# Patient Record
Sex: Male | Born: 1995
Health system: Southern US, Community
[De-identification: ages and names within clinical notes are randomized; demographics above are authoritative.]

## PROBLEM LIST (undated history)

## (undated) ENCOUNTER — Ambulatory Visit (HOSPITAL_COMMUNITY): Admission: EM | Payer: Self-pay | Source: Home / Self Care

## (undated) DIAGNOSIS — B20 Human immunodeficiency virus [HIV] disease: Secondary | ICD-10-CM

## (undated) HISTORY — PX: NO PAST SURGERIES: SHX2092

---
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
## 2017-01-06 ENCOUNTER — Telehealth: Payer: Self-pay | Admitting: *Deleted

## 2017-01-06 NOTE — Telephone Encounter (Signed)
Patient shared that he stopped taking his bipolar medication, Zoloft, a couple of months ago.  That medication is still showing on his medication profile.  He would like his medication profile updated.  He is requesting that Dr. Drue SecondSnider call [employer] to let them know that he is no longer taking the Zoloft.  Sending message to Dr. Drue SecondSnider.  Phone numbers to call at [employer] are:  [phone] or [phone].

## 2017-01-08 NOTE — Telephone Encounter (Signed)
I can do that but does it matter?

## 2017-01-09 NOTE — Telephone Encounter (Signed)
Yes,  He definitely wants Dr. Drue SecondSnider to call [employer] about the medication.  He would also appreciate Dr. Drue SecondSnider calling him at [phone].

## 2017-01-09 NOTE — Telephone Encounter (Signed)
I called and spoke to patient and left message at [employer]

## 2017-01-20 ENCOUNTER — Telehealth: Payer: Self-pay | Admitting: *Deleted

## 2017-01-20 DIAGNOSIS — B20 Human immunodeficiency virus [HIV] disease: Secondary | ICD-10-CM

## 2017-01-20 MED ORDER — ELVITEG-COBIC-EMTRICIT-TENOFAF 150-150-200-10 MG PO TABS: 1.0000 | ORAL_TABLET | Freq: Every day | ORAL | 1 refills | Status: DC

## 2017-01-20 NOTE — Telephone Encounter (Signed)
Requesting 90-day refill of Genvoya to be sent to [facility] on [address].  RX sent.

## 2017-01-28 ENCOUNTER — Other Ambulatory Visit: Payer: Self-pay | Admitting: Internal Medicine

## 2017-01-28 ENCOUNTER — Encounter: Payer: Self-pay | Admitting: Internal Medicine

## 2017-01-28 ENCOUNTER — Ambulatory Visit (INDEPENDENT_AMBULATORY_CARE_PROVIDER_SITE_OTHER): Payer: Medicaid Other | Admitting: Internal Medicine

## 2017-01-28 VITALS — BP 137/81 | HR 75 | Temp 98.0°F | Ht 64.0 in | Wt 166.0 lb

## 2017-01-28 DIAGNOSIS — B20 Human immunodeficiency virus [HIV] disease: Secondary | ICD-10-CM

## 2017-01-28 DIAGNOSIS — G44039 Episodic paroxysmal hemicrania, not intractable: Secondary | ICD-10-CM

## 2017-01-28 MED ORDER — IBUPROFEN 600 MG PO TABS: 600.0000 mg | ORAL_TABLET | Freq: Four times a day (QID) | ORAL | 0 refills | Status: DC | PRN

## 2017-01-28 MED ORDER — ELVITEG-COBIC-EMTRICIT-TENOFAF 150-150-200-10 MG PO TABS: 1.0000 | ORAL_TABLET | Freq: Every day | ORAL | 1 refills | Status: DC

## 2017-01-28 MED FILL — GENVOYA TABLET: 150-150-200 | 30 days supply | Qty: 30 | Fill #0

## 2017-01-28 NOTE — Progress Notes (Signed)
RFV: hiv disease follow up  Patient ID: Matthew Schroeder, male   DOB: 03-01-1996, 21 y.o.   MRN: [id number]  HPI Matthew Schroeder is a 21yo M with hiv disease and bipolar disease, CD 4 count of 430/VL 104. On genvoya.  He reports taking daily wihtout missing doses. He states that he has not had any unprotected sex since his last visit. 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 He has been healthy overall.  He is wondering how to set up mailing of his medications.  Outpatient Encounter Prescriptions as of 01/28/2017  Medication Sig  . elvitegravir-cobicistat-emtricitabine-tenofovir (GENVOYA) 150-150-200-10 MG TABS tablet Take 1 tablet by mouth daily with breakfast.  . ibuprofen (ADVIL,MOTRIN) 600 MG tablet Take 1 tablet (600 mg total) by mouth every 6 (six) hours as needed for mild pain or moderate pain.  . [DISCONTINUED] naproxen (NAPROSYN) 375 MG tablet Take 1 tablet (375 mg total) by mouth 2 (two) times daily. (Patient not taking: Reported on 10/29/2016)  . [DISCONTINUED] ondansetron (ZOFRAN) 4 MG tablet Take 1 tablet (4 mg total) by mouth every 6 (six) hours. (Patient not taking: Reported on 10/29/2016)  . [DISCONTINUED] sertraline (ZOLOFT) 50 MG tablet Take 50 mg by mouth daily.   No facility-administered encounter medications on file as of 01/28/2017.      Patient Active Problem List   Diagnosis Date Noted  . Mixed bipolar I disorder (HCC) 07/16/2016  . Attention deficit hyperactivity disorder (ADHD) 07/16/2016  . HIV (human immunodeficiency virus infection) (HCC) 06/08/2016     Health Maintenance Due  Topic Date Due  . INFLUENZA VACCINE  06/25/2016     Review of Systems Review of Systems  Constitutional: Negative for fever, chills, diaphoresis, activity change, appetite change, fatigue and unexpected weight change.  HENT: Negative for congestion, sore throat, rhinorrhea, sneezing, trouble swallowing and sinus pressure.  Eyes:  Negative for photophobia and visual disturbance.  Respiratory: Negative for cough, chest tightness, shortness of breath, wheezing and stridor.  Cardiovascular: Negative for chest pain, palpitations and leg swelling.  Gastrointestinal: Negative for nausea, vomiting, abdominal pain, diarrhea, constipation, blood in stool, abdominal distention and anal bleeding.  Genitourinary: Negative for dysuria, hematuria, flank pain and difficulty urinating.  Musculoskeletal: Negative for myalgias, back pain, joint swelling, arthralgias and gait problem.  Skin: Negative for color change, pallor, rash and wound.  Neurological: + occasional headache Hematological: Negative for adenopathy. Does not bruise/bleed easily.  Psychiatric/Behavioral: Negative for behavioral problems, confusion, sleep disturbance, dysphoric mood, decreased concentration and agitation.    Physical Exam   BP 137/81   Pulse 75   Temp 98 F (36.7 C) (Oral)   Ht 5\' 4"  (1.626 m)   Wt 166 lb (75.3 kg)   BMI 28.49 kg/m   Physical Exam  Constitutional: He is oriented to person, place, and time. He appears well-developed and well-nourished. No distress.  HENT:  Mouth/Throat: Oropharynx is clear and moist. No oropharyngeal exudate.  Cardiovascular: Normal rate, regular rhythm and normal heart sounds. Exam reveals no gallop and no friction rub.  No murmur heard.  Pulmonary/Chest: Effort normal and breath sounds normal. No respiratory distress. He has no wheezes.  Abdominal: Soft. Bowel sounds are normal. He exhibits no distension. There is no tenderness.  Lymphadenopathy:  He has no cervical adenopathy.  Neurological: He is alert and oriented to person, place, and time.  Skin: Skin is warm and dry. No rash noted. No erythema.  Psychiatric: He has a  normal mood and affect. His behavior is normal.    Lab Results  Component Value Date   CD4TCELL 24 (L) 10/29/2016   Lab Results  Component Value Date   CD4TABS 430 10/29/2016    CD4TABS 610 08/08/2016   Lab Results  Component Value Date   HIV1RNAQUANT 104 (H) 10/29/2016   No results found for: HEPBSAB Lab Results  Component Value Date   LABRPR NON REAC 08/08/2016    CBC Lab Results  Component Value Date   WBC 3.9 (L) 10/29/2016   RBC 5.38 10/29/2016   HGB 15.9 10/29/2016   HCT 47.7 10/29/2016   PLT 318 10/29/2016   MCV 88.7 10/29/2016   MCH 29.6 10/29/2016   MCHC 33.3 10/29/2016   RDW 15.5 (H) 10/29/2016   LYMPHSABS 1,872 10/29/2016   MONOABS 234 10/29/2016   EOSABS 78 10/29/2016    BMET Lab Results  Component Value Date   NA 138 10/29/2016   K 4.2 10/29/2016   CL 104 10/29/2016   CO2 26 10/29/2016   GLUCOSE 97 10/29/2016   BUN 12 10/29/2016   CREATININE 1.16 10/29/2016   CALCIUM 9.6 10/29/2016   GFRNONAA >89 10/29/2016   GFRAA SEE NOTE 10/29/2016      Assessment and Plan  hiv disease= he has low lever viremia. wil check hiv viral load today. Continue on genvoya for now  Health maintenance = he has had hpv and meningococcal in 2016. No need for further vaccines at this time.  Headache = will refill ibuprofen to use prn for headache  rtc in 3 months

## 2017-01-30 LAB — HIV-1 RNA QUANT-NO REFLEX-BLD
HIV 1 RNA Quant: <20 copies/mL
HIV-1 RNA Quant, Log: <1.3 Log copies/mL

## 2017-01-30 MED FILL — IBUPROFEN 600 MG TABLET: 600 | 7 days supply | Qty: 30 | Fill #0

## 2017-02-26 MED FILL — GENVOYA TABLET: 150-150-200 | 30 days supply | Qty: 30 | Fill #1

## 2017-03-21 MED FILL — GENVOYA TABLET: 150-150-200 | 30 days supply | Qty: 30 | Fill #2

## 2017-04-18 MED FILL — GENVOYA TABLET: 150-150-200 | 30 days supply | Qty: 30 | Fill #3

## 2017-04-30 ENCOUNTER — Ambulatory Visit (INDEPENDENT_AMBULATORY_CARE_PROVIDER_SITE_OTHER): Payer: Medicaid Other | Admitting: Internal Medicine

## 2017-04-30 ENCOUNTER — Encounter: Payer: Self-pay | Admitting: Internal Medicine

## 2017-04-30 VITALS — BP 124/74 | HR 72 | Temp 98.3°F | Ht 65.0 in | Wt 166.0 lb

## 2017-04-30 DIAGNOSIS — Z23 Encounter for immunization: Secondary | ICD-10-CM | POA: Diagnosis not present

## 2017-04-30 DIAGNOSIS — B20 Human immunodeficiency virus [HIV] disease: Secondary | ICD-10-CM

## 2017-04-30 DIAGNOSIS — Z79899 Other long term (current) drug therapy: Secondary | ICD-10-CM

## 2017-04-30 LAB — CBC WITH DIFFERENTIAL/PLATELET
BASOS PCT: 0 %
Basophils Absolute: 0 cells/uL (ref 0–200)
EOS PCT: 2 %
Eosinophils Absolute: 92 cells/uL (ref 15–500)
HEMATOCRIT: 48.4 % (ref 38.5–50.0)
Hemoglobin: 16 g/dL (ref 13.2–17.1)
LYMPHS PCT: 48 %
Lymphs Abs: 2208 cells/uL (ref 850–3900)
MCH: 29.9 pg (ref 27.0–33.0)
MCHC: 33.1 g/dL (ref 32.0–36.0)
MCV: 90.3 fL (ref 80.0–100.0)
MPV: 8.9 fL (ref 7.5–12.5)
Monocytes Absolute: 322 cells/uL (ref 200–950)
Monocytes Relative: 7 %
NEUTROS PCT: 43 %
Neutro Abs: 1978 cells/uL (ref 1500–7800)
PLATELETS: 257 10*3/uL (ref 140–400)
RBC: 5.36 MIL/uL (ref 4.20–5.80)
RDW: 15 % (ref 11.0–15.0)
WBC: 4.6 10*3/uL (ref 3.8–10.8)

## 2017-04-30 LAB — BASIC METABOLIC PANEL
BUN: 12 mg/dL (ref 7–20)
CO2: 22 mmol/L (ref 20–31)
Calcium: 9.5 mg/dL (ref 8.9–10.4)
Chloride: 106 mmol/L (ref 98–110)
Creat: 1.09 mg/dL (ref 0.60–1.26)
Glucose, Bld: 90 mg/dL (ref 65–99)
POTASSIUM: 4 mmol/L (ref 3.8–5.1)
SODIUM: 142 mmol/L (ref 135–146)

## 2017-04-30 NOTE — Progress Notes (Signed)
RFV: Hiv disease follow up  Patient ID: Matthew Schroeder, male   DOB: October 02, 1996, 21 y.o.   MRN: [id number]  HPI Matthew Schroeder is a 21yo M in good state of health CD 4 count of 430/VL<20. Doing well on genvoya. No health problems. Not sexually active presently  Going to school this summer  [facility] HX: no smoking, no ETOh  Outpatient Encounter Prescriptions as of 04/30/2017  Medication Sig  . elvitegravir-cobicistat-emtricitabine-tenofovir (GENVOYA) 150-150-200-10 MG TABS tablet Take 1 tablet by mouth daily with breakfast.  . ibuprofen (ADVIL,MOTRIN) 600 MG tablet Take 1 tablet (600 mg total) by mouth every 6 (six) hours as needed for mild pain or moderate pain.   No facility-administered encounter medications on file as of 04/30/2017.      Patient Active Problem List   Diagnosis Date Noted  . Mixed bipolar I disorder (HCC) 07/16/2016  . Attention deficit hyperactivity disorder (ADHD) 07/16/2016  . HIV (human immunodeficiency virus infection) (HCC) 06/08/2016     There are no preventive care reminders to display for this patient.   Review of Systems  Constitutional: Negative for fever, chills, diaphoresis, activity change, appetite change, fatigue and unexpected weight change.  HENT: Negative for congestion, sore throat, rhinorrhea, sneezing, trouble swallowing and sinus pressure.  Eyes: Negative for photophobia and visual disturbance.  Respiratory: Negative for cough, chest tightness, shortness of breath, wheezing and stridor.  Cardiovascular: Negative for chest pain, palpitations and leg swelling.  Gastrointestinal: Negative for nausea, vomiting, abdominal pain, diarrhea, constipation, blood in stool, abdominal distention and anal bleeding.  Genitourinary: Negative for dysuria, hematuria, flank pain and difficulty urinating.  Musculoskeletal: Negative for myalgias, back pain, joint swelling, arthralgias and gait problem.  Skin: Negative for color change, pallor, rash and wound.    Neurological: Negative for dizziness, tremors, weakness and light-headedness.  Hematological: Negative for adenopathy. Does not bruise/bleed easily.  Psychiatric/Behavioral: Negative for behavioral problems, confusion, sleep disturbance, dysphoric mood, decreased concentration and agitation.    Physical Exam   BP 124/74   Pulse 72   Temp 98.3 F (36.8 C) (Oral)   Ht 5\' 5"  (1.651 m)   Wt 166 lb (75.3 kg)   BMI 27.62 kg/m    Constitutional: He is oriented to person, place, and time. He appears well-developed and well-nourished. No distress.  HENT:  Mouth/Throat: Oropharynx is clear and moist. No oropharyngeal exudate.  Cardiovascular: Normal rate, regular rhythm and normal heart sounds. Exam reveals no gallop and no friction rub.  No murmur heard.  Pulmonary/Chest: Effort normal and breath sounds normal. No respiratory distress. He has no wheezes.  Lymphadenopathy:  He has no cervical adenopathy.  Neurological: He is alert and oriented to person, place, and time.  Skin: Skin is warm and dry. No rash noted. No erythema.  Psychiatric: He has mildly flat affect   Lab Results  Component Value Date   CD4TCELL 24 (L) 10/29/2016   Lab Results  Component Value Date   CD4TABS 430 10/29/2016   CD4TABS 610 08/08/2016   Lab Results  Component Value Date   HIV1RNAQUANT <20 NOT DETECTED 01/28/2017   No results found for: HEPBSAB Lab Results  Component Value Date   LABRPR NON REAC 08/08/2016    CBC Lab Results  Component Value Date   WBC 3.9 (L) 10/29/2016   RBC 5.38 10/29/2016   HGB 15.9 10/29/2016   HCT 47.7 10/29/2016   PLT 318 10/29/2016   MCV 88.7 10/29/2016   MCH 29.6 10/29/2016   MCHC 33.3 10/29/2016  RDW 15.5 (H) 10/29/2016   LYMPHSABS 1,872 10/29/2016   MONOABS 234 10/29/2016   EOSABS 78 10/29/2016    BMET Lab Results  Component Value Date   NA 138 10/29/2016   K 4.2 10/29/2016   CL 104 10/29/2016   CO2 26 10/29/2016   GLUCOSE 97 10/29/2016   BUN 12  10/29/2016   CREATININE 1.16 10/29/2016   CALCIUM 9.6 10/29/2016   GFRNONAA >89 10/29/2016   GFRAA SEE NOTE 10/29/2016      Assessment and Plan  1. HIV disease - continue on genvoya. Will check labs today. Anticipate to be well controlled 2. Health maintenance - will give pneumococcal vaccine 3. Drug SE monitoring - will check Cr function

## 2017-05-01 ENCOUNTER — Ambulatory Visit: Payer: Self-pay | Admitting: Internal Medicine

## 2017-05-01 LAB — T-HELPER CELL (CD4) - (RCID CLINIC ONLY)
CD4 % Helper T Cell: 33 % (ref 33–55)
CD4 T CELL ABS: 860 /uL (ref 400–2700)

## 2017-05-02 LAB — HIV-1 RNA QUANT-NO REFLEX-BLD
HIV 1 RNA QUANT: DETECTED {copies}/mL — AB
HIV-1 RNA QUANT, LOG: DETECTED {Log_copies}/mL — AB

## 2017-05-12 MED FILL — GENVOYA TABLET: 150-150-200 | 30 days supply | Qty: 30 | Fill #4

## 2017-05-20 ENCOUNTER — Other Ambulatory Visit: Payer: Self-pay | Admitting: Internal Medicine

## 2017-05-20 DIAGNOSIS — B20 Human immunodeficiency virus [HIV] disease: Secondary | ICD-10-CM

## 2017-06-04 MED FILL — GENVOYA TABLET: 150-150-200 | 30 days supply | Qty: 30 | Fill #5

## 2017-06-27 ENCOUNTER — Other Ambulatory Visit: Payer: Self-pay | Admitting: Internal Medicine

## 2017-06-27 DIAGNOSIS — B20 Human immunodeficiency virus [HIV] disease: Secondary | ICD-10-CM

## 2017-06-27 MED FILL — GENVOYA TABLET: 150-150-200 | 30 days supply | Qty: 30 | Fill #0

## 2017-08-05 MED FILL — GENVOYA TABLET: 150-150-200 | 30 days supply | Qty: 30 | Fill #1

## 2017-08-25 MED FILL — SERTRALINE HCL 50 MG TABLET: 50 | 30 days supply | Qty: 30 | Fill #0

## 2017-09-01 ENCOUNTER — Telehealth: Payer: Self-pay

## 2017-09-01 NOTE — Telephone Encounter (Signed)
I called pt to remind him of his appt on 09/02/17 with Dr. Drue Second he stated that he did not want to continue therapy and would like to only receive medication. I asked the pt what he meant and he stated that "I would not like to talk to anyone and only get medication." I told him that he would still need to see his provider in order to continue taking his medication. He agreed with my answer and asked for the clinic's address before ending the call.  Lorenso Courier, [place]

## 2017-09-02 ENCOUNTER — Other Ambulatory Visit (HOSPITAL_COMMUNITY)
Admission: RE | Admit: 2017-09-02 | Discharge: 2017-09-02 | Disposition: A | Discharge status: Home / Self Care | Payer: Medicaid Other | Source: Ambulatory Visit | Attending: Internal Medicine | Admitting: Internal Medicine

## 2017-09-02 ENCOUNTER — Ambulatory Visit (INDEPENDENT_AMBULATORY_CARE_PROVIDER_SITE_OTHER): Payer: Medicaid Other | Admitting: Internal Medicine

## 2017-09-02 ENCOUNTER — Encounter: Payer: Self-pay | Admitting: Internal Medicine

## 2017-09-02 VITALS — BP 142/86 | HR 69 | Temp 98.2°F | Ht 65.0 in | Wt 172.0 lb

## 2017-09-02 DIAGNOSIS — Z79899 Other long term (current) drug therapy: Secondary | ICD-10-CM | POA: Diagnosis not present

## 2017-09-02 DIAGNOSIS — B20 Human immunodeficiency virus [HIV] disease: Secondary | ICD-10-CM | POA: Insufficient documentation

## 2017-09-02 DIAGNOSIS — Z23 Encounter for immunization: Secondary | ICD-10-CM | POA: Diagnosis not present

## 2017-09-02 NOTE — Progress Notes (Signed)
RFV: follow up for hiv  Patient ID: Matthew Schroeder, male   DOB: [birth year], 21 y.o.   MRN: [id number]  HPI Nas is a 21yo M with hiv disease with wild-type geno, dx in 2017 has been on ART since Dx and has been well controlled. Not sexually active since 2017 per his report. No recent illnesses.  04/2016: hiv geno from [facility]:  RT GENE MUTATIONS: R211Q PR GENE MUTATIONS: J19J,Y78G,N56O/Z  Outpatient Encounter Prescriptions as of 09/02/2017  Medication Sig  . GENVOYA 150-150-200-10 MG TABS tablet TAKE 1 TABLET BY MOUTH DAILY WITH BREAKFAST.  Marland Kitchen ibuprofen (ADVIL,MOTRIN) 600 MG tablet Take 1 tablet (600 mg total) by mouth every 6 (six) hours as needed for mild pain or moderate pain.   No facility-administered encounter medications on file as of 09/02/2017.      Patient Active Problem List   Diagnosis Date Noted  . Mixed bipolar I disorder (HCC) 07/16/2016  . Attention deficit hyperactivity disorder (ADHD) 07/16/2016  . HIV (human immunodeficiency virus infection) (HCC) 06/08/2016     Health Maintenance Due  Topic Date Due  . INFLUENZA VACCINE  06/25/2017     Review of Systems Review of Systems  Constitutional: Negative for fever, chills, diaphoresis, activity change, appetite change, fatigue and unexpected weight change.  HENT: Negative for congestion, sore throat, rhinorrhea, sneezing, trouble swallowing and sinus pressure.  Eyes: Negative for photophobia and visual disturbance.  Respiratory: Negative for cough, chest tightness, shortness of breath, wheezing and stridor.  Cardiovascular: Negative for chest pain, palpitations and leg swelling.  Gastrointestinal: Negative for nausea, vomiting, abdominal pain, diarrhea, constipation, blood in stool, abdominal distention and anal bleeding.  Genitourinary: Negative for dysuria, hematuria, flank pain and difficulty urinating.  Musculoskeletal: Negative for myalgias, back pain, joint swelling, arthralgias and gait problem.    Skin: Negative for color change, pallor, rash and wound.  Neurological: Negative for dizziness, tremors, weakness and light-headedness.  Hematological: Negative for adenopathy. Does not bruise/bleed easily.  Psychiatric/Behavioral: Negative for behavioral problems, confusion, sleep disturbance, dysphoric mood, decreased concentration and agitation.   Social History  Substance Use Topics  . Smoking status: Never Smoker  . Smokeless tobacco: Never Used  . Alcohol use No    Physical Exam   BP (!) 142/86   Pulse 69   Temp 98.2 F (36.8 C) (Oral)   Ht  (1.651 m)   Wt 172 lb (78 kg)   BMI 28.62 kg/m   Physical Exam  Constitutional: He is oriented to person, place, and time. He appears well-developed and well-nourished. No distress.  HENT:  Mouth/Throat: Oropharynx is clear and moist. No oropharyngeal exudate.  Cardiovascular: Normal rate, regular rhythm and normal heart sounds. Exam reveals no gallop and no friction rub.  No murmur heard.  Pulmonary/Chest: Effort normal and breath sounds normal. No respiratory distress. He has no wheezes.  Lymphadenopathy:  He has no cervical adenopathy.  Neurological: He is alert and oriented to person, place, and time.  Skin: Skin is warm and dry. No rash noted. No erythema.  Psychiatric: He has a normal mood and affect. His behavior is normal.    Lab Results  Component Value Date   CD4TCELL 33 04/30/2017   Lab Results  Component Value Date   CD4TABS 860 04/30/2017   CD4TABS 430 10/29/2016   CD4TABS 610 08/08/2016   Lab Results  Component Value Date   HIV1RNAQUANT <20 DETECTED (A) 04/30/2017   No results found for: HEPBSAB Lab Results  Component Value Date  LABRPR NON REAC 08/08/2016    CBC Lab Results  Component Value Date   WBC 4.6 04/30/2017   RBC 5.36 04/30/2017   HGB 16.0 04/30/2017   HCT 48.4 04/30/2017   PLT 257 04/30/2017   MCV 90.3 04/30/2017   MCH 29.9 04/30/2017   MCHC 33.1 04/30/2017   RDW 15.0  04/30/2017   LYMPHSABS 2,208 04/30/2017   MONOABS 322 04/30/2017   EOSABS 92 04/30/2017    BMET Lab Results  Component Value Date   NA 142 04/30/2017   K 4.0 04/30/2017   CL 106 04/30/2017   CO2 22 04/30/2017   GLUCOSE 90 04/30/2017   BUN 12 04/30/2017   CREATININE 1.09 04/30/2017   CALCIUM 9.5 04/30/2017   GFRNONAA >89 10/29/2016   GFRAA SEE NOTE 10/29/2016      Assessment and Plan  hiv disease = well controlled. Will check labs and then switch to q 6 months visits. Offered to switch to biktarvy but he was not interested  Health maintenance =received flu shot today.   Long-term medication = will check cr function while on genvoya  Hx of bipolar = not currently on medications and appears stable  rtc in 6 months

## 2017-09-03 LAB — URINE CYTOLOGY ANCILLARY ONLY
Chlamydia: NEGATIVE
NEISSERIA GONORRHEA: NEGATIVE

## 2017-09-03 LAB — COMPLETE METABOLIC PANEL WITH GFR
AG Ratio: 1.5 (calc) (ref 1.0–2.5)
ALBUMIN MSPROF: 4.5 g/dL (ref 3.6–5.1)
ALT: 10 U/L (ref 8–46)
AST: 16 U/L (ref 12–32)
Alkaline phosphatase (APISO): 56 U/L (ref 48–230)
BUN: 12 mg/dL (ref 7–20)
CALCIUM: 9.6 mg/dL (ref 8.9–10.4)
CO2: 24 mmol/L (ref 20–32)
CREATININE: 1.21 mg/dL (ref 0.60–1.26)
Chloride: 106 mmol/L (ref 98–110)
GFR, EST AFRICAN AMERICAN: 100 mL/min/{1.73_m2} (ref >=60)
GFR, EST NON AFRICAN AMERICAN: 86 mL/min/{1.73_m2} (ref >=60)
GLUCOSE: 87 mg/dL (ref 65–99)
Globulin: 3.1 g/dL (calc) (ref 2.1–3.5)
Potassium: 4.3 mmol/L (ref 3.8–5.1)
Sodium: 140 mmol/L (ref 135–146)
TOTAL PROTEIN: 7.6 g/dL (ref 6.3–8.2)
Total Bilirubin: 0.5 mg/dL (ref 0.2–1.1)

## 2017-09-03 LAB — CBC WITH DIFFERENTIAL/PLATELET
Basophils Absolute: 19 cells/uL (ref 0–200)
Basophils Relative: 0.5 %
EOS PCT: 1.1 %
Eosinophils Absolute: 41 cells/uL (ref 15–500)
HEMATOCRIT: 44.9 % (ref 38.5–50.0)
HEMOGLOBIN: 15.2 g/dL (ref 13.2–17.1)
LYMPHS ABS: 2142 {cells}/uL (ref 850–3900)
MCH: 29.6 pg (ref 27.0–33.0)
MCHC: 33.9 g/dL (ref 32.0–36.0)
MCV: 87.4 fL (ref 80.0–100.0)
MONOS PCT: 7.8 %
MPV: 9.7 fL (ref 7.5–12.5)
NEUTROS ABS: 1210 {cells}/uL — AB (ref 1500–7800)
Neutrophils Relative %: 32.7 %
Platelets: 239 10*3/uL (ref 140–400)
RBC: 5.14 10*6/uL (ref 4.20–5.80)
RDW: 13.8 % (ref 11.0–15.0)
Total Lymphocyte: 57.9 %
WBC mixed population: 289 cells/uL (ref 200–950)
WBC: 3.7 10*3/uL — AB (ref 3.8–10.8)

## 2017-09-03 LAB — RPR: RPR: NONREACTIVE

## 2017-09-03 LAB — T-HELPER CELL (CD4) - (RCID CLINIC ONLY)
CD4 % Helper T Cell: 31 % — ABNORMAL LOW (ref 33–55)
CD4 T Cell Abs: 760 /uL (ref 400–2700)

## 2017-09-04 LAB — HIV-1 RNA QUANT-NO REFLEX-BLD
HIV 1 RNA Quant: <20 copies/mL
HIV-1 RNA Quant, Log: <1.3 Log copies/mL

## 2017-09-18 MED FILL — GENVOYA TABLET: 150-150-200 | 30 days supply | Qty: 30 | Fill #2
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
## 2017-10-10 MED FILL — SERTRALINE HCL 50 MG TABLET: 50 | 30 days supply | Qty: 30 | Fill #1

## 2017-10-10 MED FILL — GENVOYA TABLET: 150-150-200 | 30 days supply | Qty: 30 | Fill #3

## 2017-10-31 ENCOUNTER — Other Ambulatory Visit: Payer: Self-pay | Admitting: Pharmacist

## 2017-11-12 MED FILL — GENVOYA TABLET: 150-150-200 | 30 days supply | Qty: 30 | Fill #4

## 2017-12-12 MED FILL — GENVOYA TABLET: 150-150-200 | 30 days supply | Qty: 30 | Fill #5

## 2017-12-19 ENCOUNTER — Encounter (HOSPITAL_COMMUNITY): Payer: Self-pay

## 2017-12-19 ENCOUNTER — Emergency Department (HOSPITAL_COMMUNITY): Payer: Medicaid Other

## 2017-12-19 ENCOUNTER — Emergency Department (HOSPITAL_COMMUNITY)
Admission: EM | Admit: 2017-12-19 | Discharge: 2017-12-20 | Disposition: A | Discharge status: Home / Self Care | Payer: Medicaid Other | Attending: Emergency Medicine | Admitting: Emergency Medicine

## 2017-12-19 DIAGNOSIS — H1031 Unspecified acute conjunctivitis, right eye: Secondary | ICD-10-CM | POA: Insufficient documentation

## 2017-12-19 DIAGNOSIS — J069 Acute upper respiratory infection, unspecified: Secondary | ICD-10-CM | POA: Diagnosis not present

## 2017-12-19 DIAGNOSIS — B9789 Other viral agents as the cause of diseases classified elsewhere: Secondary | ICD-10-CM

## 2017-12-19 DIAGNOSIS — H5789 Other specified disorders of eye and adnexa: Secondary | ICD-10-CM | POA: Diagnosis present

## 2017-12-19 DIAGNOSIS — Z79899 Other long term (current) drug therapy: Secondary | ICD-10-CM | POA: Diagnosis not present

## 2017-12-19 NOTE — ED Triage Notes (Signed)
Pt states that his R eye has been red since Monday, and he has had a dry non productive cough for a month without fevers.

## 2017-12-20 MED ORDER — ERYTHROMYCIN 5 MG/GM OP OINT: TOPICAL_OINTMENT | OPHTHALMIC | 0 refills | Status: DC

## 2017-12-21 NOTE — ED Provider Notes (Signed)
[facility] EMERGENCY DEPARTMENT Provider Note   CSN: [id number] Arrival date & time: 12/19/17  2000     History   Chief Complaint Chief Complaint  Patient presents with  . Eye Problem  . Cough    HPI Matthew Schroeder is a 22 y.o. male.  HPI Patient is a healthy 22 year old male without significant past medical history presents to the emergency department complaints of redness of his bilateral eyes right greater than left over the past several days.  He also reports dry nonproductive cough.  No fevers or chills.  No shortness of breath.  No history of sick contacts.  Patient is normal.  Denies orthopnea.   History reviewed. No pertinent past medical history.  Patient Active Problem List   Diagnosis Date Noted  . Mixed bipolar I disorder (HCC) 07/16/2016  . Attention deficit hyperactivity disorder (ADHD) 07/16/2016  . HIV (human immunodeficiency virus infection) (HCC) 06/08/2016    History reviewed. No pertinent surgical history.     Home Medications    Prior to Admission medications   Medication Sig Start Date End Date Taking? Authorizing Provider  erythromycin ophthalmic ointment Place a 1/2 inch ribbon of ointment into the lower eyelid. 12/20/17   Azalia Bilis, MD  GENVOYA 150-150-200-10 MG TABS tablet TAKE 1 TABLET BY MOUTH DAILY WITH BREAKFAST. 06/27/17   Judyann Munson, MD  ibuprofen (ADVIL,MOTRIN) 600 MG tablet Take 1 tablet (600 mg total) by mouth every 6 (six) hours as needed for mild pain or moderate pain. 01/28/17   Judyann Munson, MD    Family History No family history on file.  Social History Social History   Tobacco Use  . Smoking status: Never Smoker  . Smokeless tobacco: Never Used  Substance Use Topics  . Alcohol use: No  . Drug use: No     Allergies   Patient has no known allergies.   Review of Systems Review of Systems  All other systems reviewed and are negative.    Physical Exam Updated Vital Signs BP (!)  157/81 (BP Location: Right Arm)   Pulse 86   Temp 98.3 F (36.8 C) (Oral)   Resp 16   Ht 5\' 5"  (1.651 m)   Wt 57.6 kg (127 lb)   SpO2 100%   BMI 21.13 kg/m   Physical Exam  Constitutional: He is oriented to person, place, and time. He appears well-developed and well-nourished.  HENT:  Head: Normocephalic and atraumatic.  Eyes: EOM are normal.  Bilateral conjunctival injection right greater than left.  No periorbital erythema or swelling.  Neck: Normal range of motion.  Cardiovascular: Normal rate, regular rhythm and normal heart sounds.  Pulmonary/Chest: Effort normal and breath sounds normal. No respiratory distress.  Abdominal: Soft. He exhibits no distension. There is no tenderness.  Musculoskeletal: Normal range of motion.  Neurological: He is alert and oriented to person, place, and time.  Skin: Skin is warm and dry.  Psychiatric: He has a normal mood and affect. Judgment normal.  Nursing note and vitals reviewed.    ED Treatments / Results  Labs (all labs ordered are listed, but only abnormal results are displayed) Labs Reviewed - No data to display  EKG  EKG Interpretation None       Radiology Dg Chest 2 View  Result Date: 12/19/2017 CLINICAL DATA:  Cough EXAM: CHEST  2 VIEW COMPARISON:  None. FINDINGS: The heart size and mediastinal contours are within normal limits. Both lungs are clear. The visualized skeletal structures are  unremarkable. Bilateral nipple piercings. IMPRESSION: No active cardiopulmonary disease. Electronically Signed   By: Jasmine PangKim  Fujinaga M.D.   On: 12/19/2017 21:04    Procedures Procedures (including critical care time)  Medications Ordered in ED Medications - No data to display   Initial Impression / Assessment and Plan / ED Course  I have reviewed the triage vital signs and the nursing notes.  Pertinent labs & imaging results that were available during my care of the patient were reviewed by me and considered in my medical decision  making (see chart for details).     Overall well-appearing.  Suspect viral process.  Discharged home in good condition with conservative symptomatic management.  Patient understands return to the emergency department for new or worsening symptoms  Final Clinical Impressions(s) / ED Diagnoses   Final diagnoses:  Viral URI with cough  Acute conjunctivitis of right eye, unspecified acute conjunctivitis type    ED Discharge Orders        Ordered    erythromycin ophthalmic ointment     12/20/17 0049       Azalia Bilisampos, Lucylle Foulkes, MD 12/21/17 [phone]

## 2017-12-30 MED FILL — SERTRALINE HCL 50 MG TABLET: 50 | 30 days supply | Qty: 30 | Fill #0

## 2018-01-07 ENCOUNTER — Other Ambulatory Visit: Payer: Self-pay | Admitting: Internal Medicine

## 2018-01-07 DIAGNOSIS — B20 Human immunodeficiency virus [HIV] disease: Secondary | ICD-10-CM

## 2018-01-08 MED FILL — GENVOYA TABLET: 150-150-200 | 30 days supply | Qty: 30 | Fill #0

## 2018-01-30 MED FILL — SERTRALINE HCL 50 MG TABLET: 50 | 30 days supply | Qty: 30 | Fill #1

## 2018-02-09 MED FILL — GENVOYA TABLET: 150-150-200 | 30 days supply | Qty: 30 | Fill #1

## 2018-02-17 MED FILL — lamoTRIgine 25 MG TABS: 25 | 14 days supply | Qty: 14 | Fill #0

## 2018-02-17 MED FILL — SERTRALINE HCL 100 MG TAB: 100 | 30 days supply | Qty: 30 | Fill #0

## 2018-02-17 MED FILL — traZODone HCL 50 MG TABS: 50 | 30 days supply | Qty: 30 | Fill #0

## 2018-03-03 ENCOUNTER — Ambulatory Visit: Payer: Self-pay | Admitting: Internal Medicine

## 2018-03-05 ENCOUNTER — Encounter: Payer: Self-pay | Admitting: Internal Medicine

## 2018-03-05 ENCOUNTER — Ambulatory Visit (INDEPENDENT_AMBULATORY_CARE_PROVIDER_SITE_OTHER): Payer: Medicaid Other | Admitting: Internal Medicine

## 2018-03-05 VITALS — BP 139/83 | HR 82 | Temp 98.2°F | Wt 174.0 lb

## 2018-03-05 DIAGNOSIS — F3160 Bipolar disorder, current episode mixed, unspecified: Secondary | ICD-10-CM

## 2018-03-05 DIAGNOSIS — Z79899 Other long term (current) drug therapy: Secondary | ICD-10-CM

## 2018-03-05 DIAGNOSIS — B20 Human immunodeficiency virus [HIV] disease: Secondary | ICD-10-CM

## 2018-03-05 NOTE — Progress Notes (Signed)
RFV: follow up for hiv disease  Patient ID: Matthew Schroeder, male   DOB: [birth year], 22 y.o.   MRN: [id number]  HPI Peggyann ShoalsJayquan is a 22yo M with hiv disease, CD 4 count of 760/VL<20 (oct 2018) on genvoya. The patient reports that he is not sexually active at this time, in the last year. He is planning to apply for [employer] in [place] and planning to relocate in June/july.  Outpatient Encounter Medications as of 03/05/2018  Medication Sig  . GENVOYA 150-150-200-10 MG TABS tablet TAKE 1 TABLET BY MOUTH DAILY WITH BREAKFAST.  Marland Kitchen. ibuprofen (ADVIL,MOTRIN) 600 MG tablet Take 1 tablet (600 mg total) by mouth every 6 (six) hours as needed for mild pain or moderate pain.  Marland Kitchen. erythromycin ophthalmic ointment Place a 1/2 inch ribbon of ointment into the lower eyelid. (Patient not taking: Reported on 03/05/2018)   No facility-administered encounter medications on file as of 03/05/2018.      Patient Active Problem List   Diagnosis Date Noted  . Mixed bipolar I disorder (HCC) 07/16/2016  . Attention deficit hyperactivity disorder (ADHD) 07/16/2016  . HIV (human immunodeficiency virus infection) (HCC) 06/08/2016   Social History   Tobacco Use  . Smoking status: Never Smoker  . Smokeless tobacco: Never Used  Substance Use Topics  . Alcohol use: No  . Drug use: No    There are no preventive care reminders to display for this patient.   Review of Systems Review of Systems  Constitutional: Negative for fever, chills, diaphoresis, activity change, appetite change, fatigue and unexpected weight change.  HENT: Negative for congestion, sore throat, rhinorrhea, sneezing, trouble swallowing and sinus pressure.  Eyes: Negative for photophobia and visual disturbance.  Respiratory: Negative for cough, chest tightness, shortness of breath, wheezing and stridor.  Cardiovascular: Negative for chest pain, palpitations and leg swelling.  Gastrointestinal: Negative for nausea, vomiting, abdominal pain, diarrhea,  constipation, blood in stool, abdominal distention and anal bleeding.  Genitourinary: Negative for dysuria, hematuria, flank pain and difficulty urinating.  Musculoskeletal: Negative for myalgias, back pain, joint swelling, arthralgias and gait problem.  Skin: Negative for color change, pallor, rash and wound.  Neurological: Negative for dizziness, tremors, weakness and light-headedness.  Hematological: Negative for adenopathy. Does not bruise/bleed easily.  Psychiatric/Behavioral: Negative for behavioral problems, confusion, sleep disturbance, dysphoric mood, decreased concentration and agitation.    Physical Exam   BP 139/83   Pulse 82   Temp 98.2 F (36.8 C) (Oral)   Wt 174 lb (78.9 kg)   BMI 28.96 kg/m   Physical Exam  Constitutional: He is oriented to person, place, and time. He appears well-developed and well-nourished. No distress.  HENT:  Mouth/Throat: Oropharynx is clear and moist. No oropharyngeal exudate.  Cardiovascular: Normal rate, regular rhythm and normal heart sounds. Exam reveals no gallop and no friction rub.  No murmur heard.  Pulmonary/Chest: Effort normal and breath sounds normal. No respiratory distress. He has no wheezes.  Abdominal: Soft. Bowel sounds are normal. He exhibits no distension. There is no tenderness.  Lymphadenopathy:  He has no cervical adenopathy.  Neurological: He is alert and oriented to person, place, and time.  Skin: Skin is warm and dry. No rash noted. No erythema.  Psychiatric: He has a normal mood and affect. His behavior is normal.    Lab Results  Component Value Date   CD4TCELL 31 (L) 09/02/2017   Lab Results  Component Value Date   CD4TABS 760 09/02/2017   CD4TABS 860 04/30/2017   CD4TABS 430  10/29/2016   Lab Results  Component Value Date   HIV1RNAQUANT <20 NOT DETECTED 09/02/2017   No results found for: HEPBSAB Lab Results  Component Value Date   LABRPR NON-REACTIVE 09/02/2017    CBC Lab Results  Component Value  Date   WBC 3.7 (L) 09/02/2017   RBC 5.14 09/02/2017   HGB 15.2 09/02/2017   HCT 44.9 09/02/2017   PLT 239 09/02/2017   MCV 87.4 09/02/2017   MCH 29.6 09/02/2017   MCHC 33.9 09/02/2017   RDW 13.8 09/02/2017   LYMPHSABS 2,142 09/02/2017   MONOABS 322 04/30/2017   EOSABS 41 09/02/2017    BMET Lab Results  Component Value Date   NA 140 09/02/2017   K 4.3 09/02/2017   CL 106 09/02/2017   CO2 24 09/02/2017   GLUCOSE 87 09/02/2017   BUN 12 09/02/2017   CREATININE 1.21 09/02/2017   CALCIUM 9.6 09/02/2017   GFRNONAA 86 09/02/2017   GFRAA 100 09/02/2017      Assessment and Plan  hiv disease = will plan on checking labs today. Continue on current regimen  ckd 2 = cr likely elevated due to cobi. Will check cr function as part of long term medication   Health maintenance = will check sti  Bipolar disorder = appears stable, denies depression

## 2018-03-06 LAB — COMPLETE METABOLIC PANEL WITH GFR
AG Ratio: 1.4 (calc) (ref 1.0–2.5)
ALBUMIN MSPROF: 4.6 g/dL (ref 3.6–5.1)
ALKALINE PHOSPHATASE (APISO): 53 U/L (ref 48–230)
ALT: 18 U/L (ref 8–46)
AST: 19 U/L (ref 12–32)
BUN: 12 mg/dL (ref 7–20)
CO2: 25 mmol/L (ref 20–32)
CREATININE: 1.13 mg/dL (ref 0.60–1.26)
Calcium: 10.2 mg/dL (ref 8.9–10.4)
Chloride: 104 mmol/L (ref 98–110)
GFR, Est African American: 109 mL/min/{1.73_m2} (ref >=60)
GFR, Est Non African American: 94 mL/min/{1.73_m2} (ref >=60)
Globulin: 3.3 g/dL (calc) (ref 2.1–3.5)
Glucose, Bld: 92 mg/dL (ref 65–99)
Potassium: 4.4 mmol/L (ref 3.8–5.1)
SODIUM: 139 mmol/L (ref 135–146)
TOTAL PROTEIN: 7.9 g/dL (ref 6.3–8.2)
Total Bilirubin: 0.4 mg/dL (ref 0.2–1.1)

## 2018-03-06 LAB — CBC WITH DIFFERENTIAL/PLATELET
Basophils Absolute: 19 cells/uL (ref 0–200)
Basophils Relative: 0.5 %
Eosinophils Absolute: 61 cells/uL (ref 15–500)
Eosinophils Relative: 1.6 %
HEMATOCRIT: 46.9 % (ref 38.5–50.0)
Hemoglobin: 15.8 g/dL (ref 13.2–17.1)
LYMPHS ABS: 1854 {cells}/uL (ref 850–3900)
MCH: 29.3 pg (ref 27.0–33.0)
MCHC: 33.7 g/dL (ref 32.0–36.0)
MCV: 86.9 fL (ref 80.0–100.0)
MPV: 9.5 fL (ref 7.5–12.5)
Monocytes Relative: 7.5 %
NEUTROS PCT: 41.6 %
Neutro Abs: 1581 cells/uL (ref 1500–7800)
Platelets: 299 10*3/uL (ref 140–400)
RBC: 5.4 10*6/uL (ref 4.20–5.80)
RDW: 14.4 % (ref 11.0–15.0)
Total Lymphocyte: 48.8 %
WBC: 3.8 10*3/uL (ref 3.8–10.8)
WBCMIX: 285 {cells}/uL (ref 200–950)

## 2018-03-06 LAB — RPR: RPR Ser Ql: NONREACTIVE

## 2018-03-06 LAB — T-HELPER CELL (CD4) - (RCID CLINIC ONLY)
CD4 % Helper T Cell: 33 % (ref 33–55)
CD4 T Cell Abs: 630 /uL (ref 400–2700)

## 2018-03-06 MED FILL — GENVOYA TABLET: 150-150-200 | 30 days supply | Qty: 30 | Fill #2

## 2018-03-09 LAB — HIV-1 RNA QUANT-NO REFLEX-BLD
HIV 1 RNA Quant: <20 copies/mL — AB
HIV-1 RNA Quant, Log: <1.3 Log copies/mL — AB

## 2018-03-19 MED FILL — lamoTRIgine 100 MG TABS: 100 | 30 days supply | Qty: 30 | Fill #0

## 2018-03-25 ENCOUNTER — Ambulatory Visit: Payer: Self-pay | Admitting: Internal Medicine

## 2018-03-26 MED FILL — traZODone HCL 150 MG TABS: 150 | 30 days supply | Qty: 30 | Fill #0

## 2018-03-30 ENCOUNTER — Ambulatory Visit: Payer: Self-pay | Admitting: Internal Medicine

## 2018-03-31 ENCOUNTER — Ambulatory Visit: Payer: Self-pay | Admitting: Internal Medicine

## 2018-04-02 DIAGNOSIS — F4010 Social phobia, unspecified: Secondary | ICD-10-CM | POA: Diagnosis not present

## 2018-04-02 DIAGNOSIS — F339 Major depressive disorder, recurrent, unspecified: Secondary | ICD-10-CM | POA: Diagnosis not present

## 2018-04-08 MED FILL — GENVOYA TABLET: 150-150-200 | 30 days supply | Qty: 30 | Fill #3

## 2018-04-10 ENCOUNTER — Encounter (HOSPITAL_COMMUNITY): Payer: Self-pay | Admitting: Emergency Medicine

## 2018-04-10 ENCOUNTER — Ambulatory Visit (HOSPITAL_COMMUNITY)
Admission: EM | Admit: 2018-04-10 | Discharge: 2018-04-10 | Disposition: A | Discharge status: Home / Self Care | Payer: Medicaid Other | Attending: Family Medicine | Admitting: Family Medicine

## 2018-04-10 DIAGNOSIS — Z21 Asymptomatic human immunodeficiency virus [HIV] infection status: Secondary | ICD-10-CM | POA: Diagnosis not present

## 2018-04-10 DIAGNOSIS — M79672 Pain in left foot: Secondary | ICD-10-CM | POA: Diagnosis not present

## 2018-04-10 DIAGNOSIS — M25561 Pain in right knee: Secondary | ICD-10-CM

## 2018-04-10 DIAGNOSIS — B20 Human immunodeficiency virus [HIV] disease: Secondary | ICD-10-CM

## 2018-04-10 HISTORY — DX: Human immunodeficiency virus (HIV) disease: B20

## 2018-04-10 MED ORDER — IBUPROFEN 600 MG PO TABS: 600.0000 mg | ORAL_TABLET | Freq: Four times a day (QID) | ORAL | 0 refills | Status: DC | PRN

## 2018-04-10 NOTE — ED Provider Notes (Signed)
[facility] CARE CENTER   [id number] 04/10/18 Arrival Time: 1433  SUBJECTIVE: History from: patient. Matthew Schroeder is a 22 y.o. male complains of right knee pain that began 1 day ago.  Denies a precipitating event or specific injury.  Pain is diffuse about the knee.  Describes the pain as constant.  Has NOT tried OTC medications.  Symptoms are made worse with walking and flexing his knee.  Reports previous injury when he was younger and states he sprain his ligament, but is unsure which ligament.  He complains instability, popping, and grinding.  Denies fever, chills, nausea, vomiting, erythema, ecchymosis, effusion, weakness, numbness and tingling.      Patient also complains of foot pain that began today after walking on it this morning.  Localizes pain to outside of foot.  Describes pain as constant and sharp in character.  Has NOT tried OTC medications.  Symptoms made worse with walking activities.  Denies previous hx of injury.    ROS: As per HPI.  Past Medical History:  Diagnosis Date  . HIV disease (HCC)    History reviewed. No pertinent surgical history. No Known Allergies No current facility-administered medications on file prior to encounter.    Current Outpatient Medications on File Prior to Encounter  Medication Sig Dispense Refill  . erythromycin ophthalmic ointment Place a 1/2 inch ribbon of ointment into the lower eyelid. (Patient not taking: Reported on 03/05/2018) 1 g 0  . GENVOYA 150-150-200-10 MG TABS tablet TAKE 1 TABLET BY MOUTH DAILY WITH BREAKFAST. 90 tablet 1   Social History   Socioeconomic History  . Marital status: Single    Spouse name: Not on file  . Number of children: Not on file  . Years of education: Not on file  . Highest education level: Not on file  Occupational History  . Not on file  Social Needs  . Financial resource strain: Not on file  . Food insecurity:    Worry: Not on file    Inability: Not on file  . Transportation needs:   Medical: Not on file    Non-medical: Not on file  Tobacco Use  . Smoking status: Never Smoker  . Smokeless tobacco: Never Used  Substance and Sexual Activity  . Alcohol use: No  . Drug use: No  . Sexual activity: Not Currently    Partners: Male    Birth control/protection: Condom    Comment: declined condoms  Lifestyle  . Physical activity:    Days per week: Not on file    Minutes per session: Not on file  . Stress: Not on file  Relationships  . Social connections:    Talks on phone: Not on file    Gets together: Not on file    Attends religious service: Not on file    Active member of club or organization: Not on file    Attends meetings of clubs or organizations: Not on file    Relationship status: Not on file  . Intimate partner violence:    Fear of current or ex partner: Not on file    Emotionally abused: Not on file    Physically abused: Not on file    Forced sexual activity: Not on file  Other Topics Concern  . Not on file  Social History Narrative   ** Merged History Encounter **       No family history on file.  OBJECTIVE:  Vitals:   04/10/18 1513  BP: 130/72  Pulse: 77  Resp: 16  Temp: 97.7 F (36.5 C)  TempSrc: Oral  SpO2: 98%    General appearance: AOx3; in no acute distress.  Head: NCAT Lungs: CTA bilaterally Heart: RRR.  Clear S1 and S2 without murmur, gallops, or rubs.  Radial pulses 2+ bilaterally. Musculoskeletal: Right Knee:  Inspection: Skin warm, dry, clear and intact without obvious erythema,  effusion, or ecchymosis.  Palpation: Nontender to palpation.  ROM: FROM active and passive Strength: 5/5 hip flexion, 5/5 knee abduction, 5/5 knee adduction, 5/5 knee flexion, 5/5 knee extension, 5/5 dorsiflexion, 5/5 plantar flexion Stability: Anterior/ posterior drawer intact.  Negative McMurray's test Right foot:  Inspection: Skin clear and intact without obvious erythema, effusion, or  ecchymosis.  Skin warm and dry to the touch  Palpation:  Mildly tender to the dorsal surface of the distal fifth metatarsal  ROM: FROM active and passive  Strength: see knee exam Skin: warm and dry Neurologic: Ambulates without difficulty; Sensation intact about the lower extremities Psychological: alert and cooperative; normal mood and affect  ASSESSMENT & PLAN:  1. Acute pain of right knee   2. Left foot pain   3. HIV disease (HCC)     Meds ordered this encounter  Medications  . ibuprofen (ADVIL,MOTRIN) 600 MG tablet    Sig: Take 1 tablet (600 mg total) by mouth every 6 (six) hours as needed for mild pain or moderate pain.    Dispense:  30 tablet    Refill:  0    Order Specific Question:   Supervising Provider    Answer:   Isa Rankin [[id number]]   Offered post-op shoe, patient declines at this time.   Knee brace given Continue conservative management of rest, ice, and gentle stretches Take naproxen as needed for pain relief (may cause abdominal discomfort, ulcers, and GI bleeds avoid taking with other NSAIDs) Follow up with PCP if symptoms persist Return or go to the ER if you have any new or worsening symptoms (fever, chills, chest pain, abdominal pain, changes in bowel or bladder habits, pain radiating into lower legs, etc...)   *Patient left prior to receiving knee brace and prescription.  While in the room I instructed the patient to continue with conservative management and to follow up with PCP if symptoms persisted.       Rennis Harding, PA-C 04/10/18 [phone]

## 2018-04-10 NOTE — Discharge Instructions (Addendum)
Knee brace given Continue conservative management of rest, ice, compress, elevate and exercises to strengthening the knee Take ibuprofen as needed for pain relief (may cause abdominal discomfort, ulcers, and GI bleeds avoid taking with other NSAIDs) Follow up with PCP if symptoms persist Return or go to the ER if you have any new or worsening symptoms (fever, chills, chest pain, abdominal pain, changes in bowel or bladder habits, pain radiating into lower legs, etc...)

## 2018-04-10 NOTE — ED Notes (Signed)
Pt states he had to leave to catch the bus, told pt to come back if he needs anything. Unable to put on knee sleeve.

## 2018-04-10 NOTE — ED Triage Notes (Signed)
Pt c/o R knee pain and foot pain, states yeterday he felt his knee pop. No swelling, full ROM, pt ambulatory with steady gait.

## 2018-05-05 ENCOUNTER — Emergency Department (HOSPITAL_COMMUNITY)
Admission: EM | Admit: 2018-05-05 | Discharge: 2018-05-05 | Disposition: A | Discharge status: Home / Self Care | Payer: Medicare Other | Attending: Emergency Medicine | Admitting: Emergency Medicine

## 2018-05-05 ENCOUNTER — Other Ambulatory Visit: Payer: Self-pay

## 2018-05-05 ENCOUNTER — Emergency Department (HOSPITAL_COMMUNITY): Payer: Medicare Other

## 2018-05-05 ENCOUNTER — Encounter (HOSPITAL_COMMUNITY): Payer: Self-pay

## 2018-05-05 DIAGNOSIS — M25561 Pain in right knee: Secondary | ICD-10-CM | POA: Diagnosis not present

## 2018-05-05 DIAGNOSIS — Z202 Contact with and (suspected) exposure to infections with a predominantly sexual mode of transmission: Secondary | ICD-10-CM | POA: Diagnosis not present

## 2018-05-05 DIAGNOSIS — A64 Unspecified sexually transmitted disease: Secondary | ICD-10-CM

## 2018-05-05 DIAGNOSIS — B20 Human immunodeficiency virus [HIV] disease: Secondary | ICD-10-CM | POA: Diagnosis not present

## 2018-05-05 DIAGNOSIS — R369 Urethral discharge, unspecified: Secondary | ICD-10-CM | POA: Insufficient documentation

## 2018-05-05 DIAGNOSIS — M25461 Effusion, right knee: Secondary | ICD-10-CM | POA: Diagnosis not present

## 2018-05-05 DIAGNOSIS — Z79899 Other long term (current) drug therapy: Secondary | ICD-10-CM | POA: Diagnosis not present

## 2018-05-05 LAB — BASIC METABOLIC PANEL
Anion gap: 8 (ref 5–15)
BUN: 8 mg/dL (ref 6–20)
CO2: 25 mmol/L (ref 22–32)
Calcium: 9.4 mg/dL (ref 8.9–10.3)
Chloride: 108 mmol/L (ref 101–111)
Creatinine, Ser: 1.17 mg/dL (ref 0.61–1.24)
GFR calc Af Amer: >60 mL/min (ref >60)
GFR calc non Af Amer: >60 mL/min (ref >60)
Glucose, Bld: 99 mg/dL (ref 65–99)
Potassium: 3.9 mmol/L (ref 3.5–5.1)
Sodium: 141 mmol/L (ref 135–145)

## 2018-05-05 LAB — CBC WITH DIFFERENTIAL/PLATELET
Abs Immature Granulocytes: 0 10*3/uL (ref 0.0–0.1)
Basophils Absolute: 0 10*3/uL (ref 0.0–0.1)
Basophils Relative: 0 %
Eosinophils Absolute: 0.1 10*3/uL (ref 0.0–0.7)
Eosinophils Relative: 1 %
HCT: 42.9 % (ref 39.0–52.0)
Hemoglobin: 13.9 g/dL (ref 13.0–17.0)
Immature Granulocytes: 0 %
Lymphocytes Relative: 25 %
Lymphs Abs: 1.9 10*3/uL (ref 0.7–4.0)
MCH: 28.4 pg (ref 26.0–34.0)
MCHC: 32.4 g/dL (ref 30.0–36.0)
MCV: 87.7 fL (ref 78.0–100.0)
Monocytes Absolute: 0.7 10*3/uL (ref 0.1–1.0)
Monocytes Relative: 9 %
Neutro Abs: 5 10*3/uL (ref 1.7–7.7)
Neutrophils Relative %: 65 %
Platelets: 379 10*3/uL (ref 150–400)
RBC: 4.89 MIL/uL (ref 4.22–5.81)
RDW: 13.5 % (ref 11.5–15.5)
WBC: 7.6 10*3/uL (ref 4.0–10.5)

## 2018-05-05 MED ORDER — OXYCODONE-ACETAMINOPHEN 5-325 MG PO TABS
1.0000 | ORAL_TABLET | Freq: Once | ORAL | Status: AC
  Administered 2018-05-05: 1 via ORAL
  Filled 2018-05-05: qty 1

## 2018-05-05 MED ORDER — CEFTRIAXONE SODIUM 250 MG IJ SOLR
250.0000 mg | Freq: Once | INTRAMUSCULAR | Status: AC
  Administered 2018-05-05: 250 mg via INTRAMUSCULAR
  Filled 2018-05-05: qty 250

## 2018-05-05 MED ORDER — AZITHROMYCIN 250 MG PO TABS
1000.0000 mg | ORAL_TABLET | Freq: Once | ORAL | Status: AC
  Administered 2018-05-05: 1000 mg via ORAL
  Filled 2018-05-05: qty 4

## 2018-05-05 MED ORDER — LIDOCAINE HCL (PF) 1 % IJ SOLN
INTRAMUSCULAR | Status: AC
  Administered 2018-05-05: 5 mL
  Filled 2018-05-05: qty 5

## 2018-05-05 NOTE — Discharge Instructions (Addendum)
Please read attached information. If you experience any new or worsening signs or symptoms please return to the emergency room for evaluation. Please follow-up with your primary care provider or specialist as discussed. Please use medication prescribed only as directed and discontinue taking if you have any concerning signs or symptoms.   °

## 2018-05-05 NOTE — ED Notes (Signed)
Patient transported to X-ray 

## 2018-05-05 NOTE — ED Provider Notes (Signed)
[facility] EMERGENCY [facility]MENT Provider Note   CSN: [id number] Arrival date & time: 05/05/18  1045     History   Chief Complaint Chief Complaint  Patient presents with  . Leg Pain    \  . Penile Discharge    \    HPI Matthew Schroeder is a 22 y.o. male.  HPI   22 year old male presents today with complaints of penile discharge and knee pain.  Patient reports approximately 1 week ago he developed right knee pain worse with ambulation, notes full active range of motion but pain with deep flexion.  He notes some warmth to touch, no trauma.  Patient also reports approximately 3 days ago he started having purulent penile discharge.  Denies any testicular pain, abdominal pain fever or chills.  Patient reports he does have sex with men.  He is HIV positive.  Patient denies any other joint involvement, denies any rashes.  Past Medical History:  Diagnosis Date  . HIV disease [facility](HCC)     Patient Active Problem List   Diagnosis Date Noted  . Mixed bipolar I disorder (HCC) 07/16/2016  . Attention deficit hyperactivity disorder (ADHD) 07/16/2016  . HIV (human immunodeficiency virus infection) (HCC) 06/08/2016    History reviewed. No pertinent surgical history.      Home Medications    Prior to Admission medications   Medication Sig Start Date End Date Taking? Authorizing Provider  erythromycin ophthalmic ointment Place a 1/2 inch ribbon of ointment into the lower eyelid. Patient not taking: Reported on 03/05/2018 12/20/17   Azalia Bilisampos, Kevin, MD  GENVOYA 150-150-200-10 MG TABS tablet TAKE 1 TABLET BY MOUTH DAILY WITH BREAKFAST. 01/07/18   Judyann MunsonSnider, Cynthia, MD  ibuprofen (ADVIL,MOTRIN) 600 MG tablet Take 1 tablet (600 mg total) by mouth every 6 (six) hours as needed for mild pain or moderate pain. 04/10/18   Rennis HardingWurst, Brittany, PA-C    Family History History reviewed. No pertinent family history.  Social History Social History   Tobacco Use  . Smoking status: Never  Smoker  . Smokeless tobacco: Never Used  Substance Use Topics  . Alcohol use: No  . Drug use: No     Allergies   Patient has no known allergies.   Review of Systems Review of Systems  All other systems reviewed and are negative.    Physical Exam Updated Vital Signs BP (!) 161/83 (BP Location: Right Arm)   Pulse 94   Temp 98.2 F (36.8 C) (Oral)   Resp 16   Ht 5\' 5"  (1.651 m)   Wt 72.6 kg (160 lb)   SpO2 100%   BMI 26.63 kg/m   Physical Exam  Constitutional: He is oriented to person, place, and time. He appears well-developed and well-nourished.  HENT:  Head: Normocephalic and atraumatic.  Eyes: Pupils are equal, round, and reactive to light. Conjunctivae are normal. Right eye exhibits no discharge. Left eye exhibits no discharge. No scleral icterus.  Neck: Normal range of motion. No JVD present. No tracheal deviation present.  Pulmonary/Chest: Effort normal. No stridor.  Abdominal: Soft. He exhibits no distension and no mass. There is no tenderness. There is no rebound and no guarding. No hernia.  Genitourinary:  Genitourinary Comments: Circumcised penis, no obvious discharge, nontender  Musculoskeletal:  Minor swelling noted to the right knee, slight warmth to touch, no significant redness, greater than 90 degrees of flexion noted  Neurological: He is alert and oriented to person, place, and time. Coordination normal.  Skin:  No  rashes or lesions   Psychiatric: He has a normal mood and affect. His behavior is normal. Judgment and thought content normal.  Nursing note and vitals reviewed.    ED Treatments / Results  Labs (all labs ordered are listed, but only abnormal results are displayed) Labs Reviewed  GC/CHLAMYDIA PROBE AMP ([place]) NOT AT [facility] - Abnormal; Notable for the following components:      Result Value   Neisseria gonorrhea **POSITIVE** (*)    All other components within normal limits  CBC WITH DIFFERENTIAL/PLATELET  BASIC METABOLIC PANEL    RPR    EKG None  Radiology Dg Knee Complete 4 Views Right  Result Date: 05/05/2018 CLINICAL DATA:  Pain for 1 week EXAM: RIGHT KNEE - COMPLETE 4+ VIEW COMPARISON:  None. FINDINGS: Frontal, lateral common bile oblique views were obtained. No fracture or dislocation. There is a small joint effusion. There is no appreciable joint space narrowing or erosion. IMPRESSION: Small joint effusion. No fracture or dislocation. No appreciable arthropathy. Electronically Signed   By: Bretta Bang III M.D.   On: 05/05/2018 14:05    Procedures Procedures (including critical care time)  Medications Ordered in ED Medications  oxyCODONE-acetaminophen (PERCOCET/ROXICET) 5-325 MG per tablet 1 tablet (1 tablet Oral Given 05/05/18 1406)  cefTRIAXone (ROCEPHIN) injection 250 mg (250 mg Intramuscular Given 05/05/18 1533)  azithromycin (ZITHROMAX) tablet 1,000 mg (1,000 mg Oral Given 05/05/18 1533)  lidocaine (PF) (XYLOCAINE) 1 % injection (5 mLs  Given 05/05/18 1533)     Initial Impression / Assessment and Plan / ED Course  I have reviewed the triage vital signs and the nursing notes.  Pertinent labs & imaging results that were available during my care of the patient were reviewed by me and considered in my medical decision making (see chart for details).     Labs: GC RPR BMP CBC  Imaging: DG knee complete 4 view  Consults:  Therapeutics: Ceftriaxone, azithromycin  Discharge Meds:   Assessment/Plan: 22 year old male presents today with complaints of penile discharge and leg pain.  Patient does have minor swelling noted to the knee, he has full active range of motion, no significant redness and ambulates without significant difficulty.  Patient also noting penile discharge.  Patient will be treated for STDs, I have low suspicion for septic arthritis in this patient, question reactive arthritis.  Patient has no signs of systemic illness, well-appearing.  He will be discharged with return precautions  in 24 to 48 hours if symptoms persist, sooner as needed, outpatient follow-up recommended.  Patient verbalized understanding and agreement to today's plan had no further questions or concerns at time discharge.    Final Clinical Impressions(s) / ED Diagnoses   Final diagnoses:  STD (male)  Acute pain of right knee    ED Discharge Orders    None       Rosalio Loud 05/07/18 1208    Maia Plan, MD 05/07/18 1345

## 2018-05-05 NOTE — ED Notes (Signed)
ED Provider at bedside. 

## 2018-05-05 NOTE — ED Triage Notes (Signed)
Pt endorses greenish penile discharge and recent unprotected sex. Also complains of right lower leg pain. PMS intact.

## 2018-05-05 NOTE — ED Notes (Signed)
Pt verbalized understanding of discharge instructions and denies any further questions at this time.   

## 2018-05-06 ENCOUNTER — Ambulatory Visit: Payer: Self-pay | Admitting: Internal Medicine

## 2018-05-07 LAB — GC/CHLAMYDIA PROBE AMP (~~LOC~~) NOT AT ARMC
Chlamydia: NEGATIVE
Neisseria Gonorrhea: POSITIVE — AB

## 2018-05-07 LAB — RPR: RPR Ser Ql: REACTIVE — AB

## 2018-05-07 LAB — RPR, QUANT+TP ABS (REFLEX)
Rapid Plasma Reagin, Quant: 1:16 {titer} — ABNORMAL HIGH
T Pallidum Abs: POSITIVE — AB

## 2018-05-08 ENCOUNTER — Telehealth: Payer: Self-pay

## 2018-05-08 ENCOUNTER — Telehealth: Payer: Self-pay | Admitting: Behavioral Health

## 2018-05-08 NOTE — Telephone Encounter (Signed)
Pt called today stating [facility] stating he had tested positive for Syphillis and that he would need to get treatment. PT stated that was told by rn at hospital to go see his regular MD for treatment. Pt was trying to schedule an appt for treatment but was told by RN at our office that he must go to health department at our office for treatment. PT was not happy with this answer and discontinued the call.   Pt called again this time with scheduling to make an appt for a nurse visit front desk transferred called to triage. I took the call and informed pt that since he did not get positive test at our office he must be seen at the health department. PT stated he would prefer to be seen at our clinic since he is already an established pt. Again informed pt that he would need to be seen at the health department for treatment. Pt verbalized understanding and stated he would try to go to the health department.  Matthew Schroeder

## 2018-05-08 NOTE — Telephone Encounter (Signed)
Patient called stating he went to the ED 05/05/2018  and now needs to be [facility].  Patient was vague and would not explain what was going on.  Informed him he missed his appointment with Dr. Drue SecondSnider 05/06/2018 and her next available appointment is in July.  Informed patient we do not do walk in STD treatment in the clinic and [facility]  Attempted to give patient the phone number to make an appointment but he hung up the phone.  Patient called back and [facility] CMA. Angeline SlimAshley Hill RN

## 2018-05-12 MED FILL — GENVOYA TABLET: 150-150-200 | 30 days supply | Qty: 30 | Fill #4

## 2018-05-13 ENCOUNTER — Encounter: Payer: Self-pay | Admitting: Internal Medicine

## 2018-05-13 ENCOUNTER — Encounter (HOSPITAL_COMMUNITY): Payer: Self-pay | Admitting: Emergency Medicine

## 2018-05-13 ENCOUNTER — Ambulatory Visit (HOSPITAL_COMMUNITY)
Admission: EM | Admit: 2018-05-13 | Discharge: 2018-05-13 | Disposition: A | Discharge status: Home / Self Care | Payer: Medicare Other | Attending: Internal Medicine | Admitting: Internal Medicine

## 2018-05-13 DIAGNOSIS — A539 Syphilis, unspecified: Secondary | ICD-10-CM | POA: Diagnosis not present

## 2018-05-13 MED ORDER — PENICILLIN G BENZATHINE 1200000 UNIT/2ML IM SUSP
2.4000 10*6.[IU] | Freq: Once | INTRAMUSCULAR | Status: AC
  Administered 2018-05-13: 2.4 10*6.[IU] via INTRAMUSCULAR

## 2018-05-13 MED ORDER — PENICILLIN G BENZATHINE 1200000 UNIT/2ML IM SUSP
INTRAMUSCULAR | Status: AC
  Filled 2018-05-13: qty 4

## 2018-05-13 NOTE — ED Triage Notes (Signed)
Pt here for treatment for syphilis; pt already treated for other STDs

## 2018-05-13 NOTE — Discharge Instructions (Signed)
We gave you a shot of penicillin today  Please follow-up with infectious disease, you need to have repeat testing at 6 months and 12 months.

## 2018-05-13 NOTE — ED Provider Notes (Signed)
[facility]    CSN: [id number] Arrival date & time: 05/13/18  1327     History   Chief Complaint Chief Complaint  Patient presents with  . Exposure to STD    HPI Matthew Schroeder is a 22 y.o. male history of HIV presenting today for treatment of syphilis.  He recently was evaluated in the emergency room and tested positive for syphilis.  He is here for treatment.  He does not have any questions, he denies any new symptoms or any issues.  He states that he was treated for other wrist TDs while he was there, initially presenting with penile discharge and knee pain.  HPI  Past Medical History:  Diagnosis Date  . HIV disease [facility])     Patient Active Problem List   Diagnosis Date Noted  . Mixed bipolar I disorder (HCC) 07/16/2016  . Attention deficit hyperactivity disorder (ADHD) 07/16/2016  . HIV (human immunodeficiency virus infection) (HCC) 06/08/2016    History reviewed. No pertinent surgical history.     Home Medications    Prior to Admission medications   Medication Sig Start Date End Date Taking? Authorizing Provider  erythromycin ophthalmic ointment Place a 1/2 inch ribbon of ointment into the lower eyelid. Patient not taking: Reported on 03/05/2018 12/20/17   Azalia Bilis, MD  GENVOYA 150-150-200-10 MG TABS tablet TAKE 1 TABLET BY MOUTH DAILY WITH BREAKFAST. 01/07/18   Judyann Munson, MD  ibuprofen (ADVIL,MOTRIN) 600 MG tablet Take 1 tablet (600 mg total) by mouth every 6 (six) hours as needed for mild pain or moderate pain. 04/10/18   Rennis Harding, PA-C    Family History History reviewed. No pertinent family history.  Social History Social History   Tobacco Use  . Smoking status: Never Smoker  . Smokeless tobacco: Never Used  Substance Use Topics  . Alcohol use: No  . Drug use: No     Allergies   Patient has no known allergies.   Review of Systems Review of Systems  Constitutional: Negative for fever.  HENT: Negative for sore  throat.   Respiratory: Negative for shortness of breath.   Cardiovascular: Negative for chest pain.  Gastrointestinal: Negative for abdominal pain, nausea and vomiting.  Genitourinary: Negative for difficulty urinating, discharge, dysuria, frequency, penile pain, penile swelling, scrotal swelling and testicular pain.  Skin: Negative for rash.  Neurological: Negative for dizziness, light-headedness and headaches.     Physical Exam Triage Vital Signs ED Triage Vitals  Enc Vitals Group     BP 05/13/18 1349 (!) 155/90     Pulse Rate 05/13/18 1349 80     Resp 05/13/18 1349 18     Temp 05/13/18 1349 98.5 F (36.9 C)     Temp Source 05/13/18 1349 Oral     SpO2 05/13/18 1349 96 %     Weight --      Height --      Head Circumference --      Peak Flow --      Pain Score 05/13/18 1350 6     Pain Loc --      Pain Edu? --      Excl. in GC? --    No data found.  Updated Vital Signs BP (!) 155/90 (BP Location: Left Arm)   Pulse 80   Temp 98.5 F (36.9 C) (Oral)   Resp 18   SpO2 96%   Visual Acuity Right Eye Distance:   Left Eye Distance:   Bilateral Distance:  Right Eye Near:   Left Eye Near:    Bilateral Near:     Physical Exam  Constitutional: He appears well-developed and well-nourished.  HENT:  Head: Normocephalic and atraumatic.  Eyes: Conjunctivae are normal.  Neck: Neck supple.  Cardiovascular: Normal rate.  Pulmonary/Chest: Effort normal. No respiratory distress.  Abdominal: Soft. There is no tenderness.  Musculoskeletal: He exhibits no edema.  Neurological: He is alert.  Skin: Skin is warm and dry.  Psychiatric: He has a normal mood and affect.  Nursing note and vitals reviewed.    UC Treatments / Results  Labs (all labs ordered are listed, but only abnormal results are displayed) Labs Reviewed - No data to display  EKG None  Radiology No results found.  Procedures Procedures (including critical care time)  Medications Ordered in  UC Medications  penicillin g benzathine (BICILLIN LA) 1200000 UNIT/2ML injection 2.4 Million Units (2.4 Million Units Intramuscular Given 05/13/18 1424)    Initial Impression / Assessment and Plan / UC Course  I have reviewed the triage vital signs and the nursing notes.  Pertinent labs & imaging results that were available during my care of the patient were reviewed by me and considered in my medical decision making (see chart for details).     Treatment for syphilis provided with 2.4 million units of pen G.  Left patient follow-up with infectious disease for recheck in 6 months and 12 months.Discussed strict return precautions. Patient verbalized understanding and is agreeable with plan.  Final Clinical Impressions(s) / UC Diagnoses   Final diagnoses:  Syphilis     Discharge Instructions     We gave you a shot of penicillin today  Please follow-up with infectious disease, you need to have repeat testing at 6 months and 12 months.     ED Prescriptions    None     Controlled Substance Prescriptions [place] Controlled Substance Registry consulted? Not Applicable   Lew DawesWieters, Corlis Angelica C, [place]PA-C 05/13/18 1659

## 2018-05-21 DIAGNOSIS — G479 Sleep disorder, unspecified: Secondary | ICD-10-CM | POA: Diagnosis not present

## 2018-05-21 DIAGNOSIS — F3130 Bipolar disorder, current episode depressed, mild or moderate severity, unspecified: Secondary | ICD-10-CM | POA: Diagnosis not present

## 2018-05-27 ENCOUNTER — Ambulatory Visit (INDEPENDENT_AMBULATORY_CARE_PROVIDER_SITE_OTHER): Payer: Medicare Other | Admitting: Internal Medicine

## 2018-05-27 VITALS — BP 136/86 | HR 71 | Temp 97.8°F | Wt 174.0 lb

## 2018-05-27 DIAGNOSIS — Z23 Encounter for immunization: Secondary | ICD-10-CM

## 2018-05-27 DIAGNOSIS — B20 Human immunodeficiency virus [HIV] disease: Secondary | ICD-10-CM | POA: Diagnosis present

## 2018-05-27 DIAGNOSIS — Z79899 Other long term (current) drug therapy: Secondary | ICD-10-CM | POA: Diagnosis not present

## 2018-05-27 DIAGNOSIS — F3160 Bipolar disorder, current episode mixed, unspecified: Secondary | ICD-10-CM

## 2018-05-27 NOTE — Progress Notes (Signed)
RFV: follow up for hiv disease  Patient ID: Matthew Schroeder, male   DOB: 1995/11/28, 22 y.o.   MRN: [id number]  HPI  Matthew Schroeder with HIV disease, Cd 4 count 630/VL>20 in April. Got treated for syphilis as well as GC. Was symptomatic for GC with penile discharged which has now improved. He is getting his application for work program and relocation to [place] Outpatient Encounter Medications as of 05/27/2018  Medication Sig  . erythromycin ophthalmic ointment Place a 1/2 inch ribbon of ointment into the lower eyelid.  Marland Kitchen GENVOYA 150-150-200-10 MG TABS tablet TAKE 1 TABLET BY MOUTH DAILY WITH BREAKFAST.  Marland Kitchen ibuprofen (ADVIL,MOTRIN) 600 MG tablet Take 1 tablet (600 mg total) by mouth every 6 (six) hours as needed for mild pain or moderate pain.   No facility-administered encounter medications on file as of 05/27/2018.      Patient Active Problem List   Diagnosis Date Noted  . Mixed bipolar I disorder (HCC) 07/16/2016  . Attention deficit hyperactivity disorder (ADHD) 07/16/2016  . HIV (human immunodeficiency virus infection) (HCC) 06/08/2016   Social History   Tobacco Use  . Smoking status: Never Smoker  . Smokeless tobacco: Never Used  Substance Use Topics  . Alcohol use: No  . Drug use: No    There are no preventive care reminders to display for this patient.   Review of Systems Review of Systems  Constitutional: Negative for fever, chills, diaphoresis, activity change, appetite change, fatigue and unexpected weight change.  HENT: Negative for congestion, sore throat, rhinorrhea, sneezing, trouble swallowing and sinus pressure.  Eyes: Negative for photophobia and visual disturbance.  Respiratory: Negative for cough, chest tightness, shortness of breath, wheezing and stridor.  Cardiovascular: Negative for chest pain, palpitations and leg swelling.  Gastrointestinal: Negative for nausea, vomiting, abdominal pain, diarrhea, constipation, blood in stool, abdominal distention and anal  bleeding.  Genitourinary: Negative for dysuria, hematuria, flank pain and difficulty urinating.  Musculoskeletal: Negative for myalgias, back pain, joint swelling, arthralgias and gait problem.  Skin: Negative for color change, pallor, rash and wound.  Neurological: Negative for dizziness, tremors, weakness and light-headedness.  Hematological: Negative for adenopathy. Does not bruise/bleed easily.  Psychiatric/Behavioral: Negative for behavioral problems, confusion, sleep disturbance, dysphoric mood, decreased concentration and agitation.    Physical Exam   BP 136/86   Pulse 71   Temp 97.8 F (36.6 C) (Oral)   Wt 174 lb (78.9 kg)   BMI 28.96 kg/m   Physical Exam  Constitutional: He is oriented to person, place, and time. He appears well-developed and well-nourished. No distress.  HENT:  Mouth/Throat: Oropharynx is clear and moist. No oropharyngeal exudate.  Cardiovascular: Normal rate, regular rhythm and normal heart sounds. Exam reveals no gallop and no friction rub.  No murmur heard.  Pulmonary/Chest: Effort normal and breath sounds normal. No respiratory distress. He has no wheezes.  Abdominal: Soft. Bowel sounds are normal. He exhibits no distension. There is no tenderness.  Lymphadenopathy:  He has no cervical adenopathy.  Neurological: He is alert and oriented to person, place, and time.  Skin: Skin is warm and dry. No rash noted. No erythema.  Psychiatric: He has a normal mood and affect. His behavior is normal.    Lab Results  Component Value Date   CD4TCELL 33 03/05/2018   Lab Results  Component Value Date   CD4TABS 630 03/05/2018   CD4TABS 760 09/02/2017   CD4TABS 860 04/30/2017   Lab Results  Component Value Date   HIV1RNAQUANT <20  DETECTED (A) 03/05/2018   No results found for: HEPBSAB Lab Results  Component Value Date   LABRPR Reactive (A) 05/05/2018    CBC Lab Results  Component Value Date   WBC 7.6 05/05/2018   RBC 4.89 05/05/2018   HGB 13.9  05/05/2018   HCT 42.9 05/05/2018   PLT 379 05/05/2018   MCV 87.7 05/05/2018   MCH 28.4 05/05/2018   MCHC 32.4 05/05/2018   RDW 13.5 05/05/2018   LYMPHSABS 1.9 05/05/2018   MONOABS 0.7 05/05/2018   EOSABS 0.1 05/05/2018    BMET Lab Results  Component Value Date   NA 141 05/05/2018   K 3.9 05/05/2018   CL 108 05/05/2018   CO2 25 05/05/2018   GLUCOSE 99 05/05/2018   BUN 8 05/05/2018   CREATININE 1.17 05/05/2018   CALCIUM 9.4 05/05/2018   GFRNONAA >60 05/05/2018   GFRAA >60 05/05/2018      Assessment and Plan  hiv disease = will continue with genvoya  Needs forms filled for his new program in [place]   Hx of sti = will recheck screening  Bipolar disorder = stable, not on meds presently  Long term medication = cr is stable. No need to change meds at tis time.  health promotion =Will give prevnar 13

## 2018-06-01 ENCOUNTER — Telehealth: Payer: Self-pay

## 2018-06-01 ENCOUNTER — Other Ambulatory Visit: Payer: Self-pay | Admitting: *Deleted

## 2018-06-01 ENCOUNTER — Other Ambulatory Visit: Payer: Medicare Other

## 2018-06-01 DIAGNOSIS — Z21 Asymptomatic human immunodeficiency virus [HIV] infection status: Secondary | ICD-10-CM

## 2018-06-01 NOTE — Telephone Encounter (Signed)
Called pt per Dr. Drue SecondSnider to inform pt that paper work left during last visit 05/27/18 is complete and ready to pick up, Pt required a form filled out by Md with proof of Viral load and CD4. Left a copy of paper work at [employer] desk for pt to pick up. Called pt to inform him that he would be able to pick up paper work at the front desk. Pt is aware and will come by later today, Lorenso CourierJose L Maldonado, CMA

## 2018-06-03 LAB — HIV-1 RNA QUANT-NO REFLEX-BLD
HIV 1 RNA QUANT: NOT DETECTED {copies}/mL
HIV-1 RNA Quant, Log: <1.3 Log copies/mL

## 2018-06-03 LAB — T-HELPER CELL (CD4) - (RCID CLINIC ONLY)
CD4 T CELL HELPER: 30 % — AB (ref 33–55)
CD4 T Cell Abs: 760 /uL (ref 400–2700)

## 2018-06-08 MED FILL — GENVOYA TABLET: 150-150-200 | 30 days supply | Qty: 30 | Fill #5

## 2018-06-16 DIAGNOSIS — F339 Major depressive disorder, recurrent, unspecified: Secondary | ICD-10-CM | POA: Diagnosis not present

## 2018-06-16 DIAGNOSIS — F3130 Bipolar disorder, current episode depressed, mild or moderate severity, unspecified: Secondary | ICD-10-CM | POA: Diagnosis not present

## 2018-06-16 DIAGNOSIS — F429 Obsessive-compulsive disorder, unspecified: Secondary | ICD-10-CM | POA: Diagnosis not present

## 2018-06-16 MED FILL — traZODone HCL 150 MG TABS: 150 | 90 days supply | Qty: 90 | Fill #0

## 2018-06-17 ENCOUNTER — Ambulatory Visit: Payer: Medicare Other | Admitting: Internal Medicine

## 2018-06-23 ENCOUNTER — Encounter: Payer: Self-pay | Admitting: Internal Medicine

## 2018-07-06 ENCOUNTER — Other Ambulatory Visit: Payer: Self-pay | Admitting: Internal Medicine

## 2018-07-06 DIAGNOSIS — B20 Human immunodeficiency virus [HIV] disease: Secondary | ICD-10-CM

## 2018-07-06 MED FILL — GENVOYA TABLET: 150-150-200 | 30 days supply | Qty: 30 | Fill #0

## 2018-07-23 DIAGNOSIS — F3130 Bipolar disorder, current episode depressed, mild or moderate severity, unspecified: Secondary | ICD-10-CM | POA: Diagnosis not present

## 2018-07-23 DIAGNOSIS — F4010 Social phobia, unspecified: Secondary | ICD-10-CM | POA: Diagnosis not present

## 2018-07-23 DIAGNOSIS — G479 Sleep disorder, unspecified: Secondary | ICD-10-CM | POA: Diagnosis not present

## 2018-07-23 DIAGNOSIS — F339 Major depressive disorder, recurrent, unspecified: Secondary | ICD-10-CM | POA: Diagnosis not present

## 2018-07-23 MED FILL — SERTRALINE HCL 100 MG TAB: 100 | 90 days supply | Qty: 135 | Fill #0

## 2018-07-23 MED FILL — lamoTRIgine 100 MG TABS: 100 | 30 days supply | Qty: 60 | Fill #0

## 2018-07-30 MED FILL — GENVOYA TABLET: 150-150-200 | 30 days supply | Qty: 30 | Fill #1

## 2018-08-13 DIAGNOSIS — G479 Sleep disorder, unspecified: Secondary | ICD-10-CM | POA: Diagnosis not present

## 2018-08-13 DIAGNOSIS — F3130 Bipolar disorder, current episode depressed, mild or moderate severity, unspecified: Secondary | ICD-10-CM | POA: Diagnosis not present

## 2018-08-13 DIAGNOSIS — Z6830 Body mass index (BMI) 30.0-30.9, adult: Secondary | ICD-10-CM | POA: Diagnosis not present

## 2018-08-13 MED FILL — SERTRALINE HCL 100 MG TAB: 100 | 90 days supply | Qty: 135 | Fill #0

## 2018-08-13 MED FILL — traZODone HCL 150 MG TABS: 150 | 90 days supply | Qty: 90 | Fill #0

## 2018-08-13 MED FILL — lamoTRIgine 100 MG TABS: 100 | 90 days supply | Qty: 180 | Fill #0

## 2018-08-27 ENCOUNTER — Ambulatory Visit: Payer: Medicare Other | Admitting: Internal Medicine

## 2018-09-01 ENCOUNTER — Telehealth: Payer: Self-pay | Admitting: Pharmacy Technician

## 2018-09-01 NOTE — Telephone Encounter (Signed)
I called Matthew Schroeder about refilling his medications with [facility].  He said he had moved to [place] and will see a new doctor there tomorrow.  I mentioned that he has an appointment with Dr. Drue Second here tomorrow.  He apologized, said that he needs to cancel and he was sorry, he had been so busy with the move.  He also stated that he would like to keep his files open with [place].

## 2018-09-02 ENCOUNTER — Ambulatory Visit: Payer: Medicare Other | Admitting: Internal Medicine

## 2018-10-02 ENCOUNTER — Other Ambulatory Visit: Payer: Self-pay | Admitting: Internal Medicine

## 2018-10-02 DIAGNOSIS — B20 Human immunodeficiency virus [HIV] disease: Secondary | ICD-10-CM

## 2018-10-13 ENCOUNTER — Other Ambulatory Visit: Payer: Self-pay | Admitting: Behavioral Health

## 2018-10-13 DIAGNOSIS — B20 Human immunodeficiency virus [HIV] disease: Secondary | ICD-10-CM

## 2018-10-13 MED ORDER — ELVITEG-COBIC-EMTRICIT-TENOFAF 150-150-200-10 MG PO TABS: 1.0000 | ORAL_TABLET | Freq: Every day | ORAL | 0 refills | Status: DC

## 2018-11-12 ENCOUNTER — Other Ambulatory Visit: Payer: Medicare Other

## 2018-11-13 ENCOUNTER — Other Ambulatory Visit: Payer: Medicare Other

## 2018-11-16 ENCOUNTER — Other Ambulatory Visit: Payer: Self-pay

## 2018-11-16 ENCOUNTER — Other Ambulatory Visit: Payer: Medicare Other

## 2018-11-16 DIAGNOSIS — B20 Human immunodeficiency virus [HIV] disease: Secondary | ICD-10-CM

## 2018-11-16 MED ORDER — ELVITEG-COBIC-EMTRICIT-TENOFAF 150-150-200-10 MG PO TABS: 1.0000 | ORAL_TABLET | Freq: Every day | ORAL | 0 refills | Status: DC

## 2018-11-20 ENCOUNTER — Ambulatory Visit (INDEPENDENT_AMBULATORY_CARE_PROVIDER_SITE_OTHER): Payer: Medicare Other | Admitting: Family

## 2018-11-20 ENCOUNTER — Encounter: Payer: Self-pay | Admitting: Family

## 2018-11-20 VITALS — Ht 64.0 in | Wt 173.0 lb

## 2018-11-20 DIAGNOSIS — Z0000 Encounter for general adult medical examination without abnormal findings: Secondary | ICD-10-CM | POA: Insufficient documentation

## 2018-11-20 DIAGNOSIS — B20 Human immunodeficiency virus [HIV] disease: Secondary | ICD-10-CM | POA: Diagnosis not present

## 2018-11-20 DIAGNOSIS — Z21 Asymptomatic human immunodeficiency virus [HIV] infection status: Secondary | ICD-10-CM | POA: Diagnosis not present

## 2018-11-20 DIAGNOSIS — F3160 Bipolar disorder, current episode mixed, unspecified: Secondary | ICD-10-CM

## 2018-11-20 LAB — T-HELPER CELL (CD4) - (RCID CLINIC ONLY)
CD4 T CELL ABS: 870 /uL (ref 400–2700)
CD4 T CELL HELPER: 36 % (ref 33–55)

## 2018-11-20 MED ORDER — ELVITEG-COBIC-EMTRICIT-TENOFAF 150-150-200-10 MG PO TABS: 1.0000 | ORAL_TABLET | Freq: Every day | ORAL | 0 refills | Status: DC

## 2018-11-20 NOTE — Patient Instructions (Signed)
Nice to meet you!  Please continue to take you Genvoya.   We will check your blood work today.  Best wishes in school.   Please call the office in [place] to schedule a follow up / establish care appointment. Please call our office if you are unable to do so to schedule a follow up.  Have a safe and Happy New Year!

## 2018-11-20 NOTE — Assessment & Plan Note (Signed)
Mr. Matthew Schroeder appears to have well controlled HIV disease and is virally suppressed with Genvoya. No signs/symptoms of opportunistic infection or progressive HIV disease. Will check blood work today. Continue current dose of Genvoya. [place]. Follow up with [facility] if needed.

## 2018-11-20 NOTE — Assessment & Plan Note (Signed)
Stable with no evidence of exacerbation or psychosis without medication. Continue to monitor.

## 2018-11-20 NOTE — Progress Notes (Signed)
Subjective:    Patient ID: Matthew Schroeder, male    DOB: 1996-10-12, 22 y.o.   MRN: [id number]  Chief Complaint  Patient presents with  . Follow-up    B20     HPI:  Matthew Schroeder is a 22 y.o. male who presents today for routine follow up of HIV disease.  Matthew Schroeder was last seen in the office on 05/27/18 for routine follow up with good adherence and tolerance of his antiretroviral regimen of Genvoya. He was virally supppressed and undetectable at the time with a CD4 count of 760. Health maintenance due includes influenza vaccination.  Matthew Schroeder has been taking his Genvoya as prescribed with no adverse side effects or missed doses. [facility] and receives his medication without difficulty.  He has been to the dentist recently and plans on getting his influenza vaccination at school. 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 Planning on transferring care after this office visit. He is not currently sexually active. Denies fevers, chills, night sweats, headaches, changes in vision, neck pain/stiffness, nausea, diarrhea, vomiting, lesions or rashes.   Medicaid  No Known Allergies    Outpatient Medications Prior to Visit  Medication Sig Dispense Refill  . ibuprofen (ADVIL,MOTRIN) 600 MG tablet Take 1 tablet (600 mg total) by mouth every 6 (six) hours as needed for mild pain or moderate pain. 30 tablet 0  . elvitegravir-cobicistat-emtricitabine-tenofovir (GENVOYA) 150-150-200-10 MG TABS tablet Take 1 tablet by mouth daily with breakfast. 90 tablet 0  . erythromycin ophthalmic ointment Place a 1/2 inch ribbon of ointment into the lower eyelid. 1 g 0   No facility-administered medications prior to visit.      Past Medical History:  Diagnosis Date  . HIV disease (HCC)      History reviewed. No pertinent surgical history.   Review of Systems  Constitutional: Negative for appetite change, chills, fatigue, fever and  unexpected weight change.  Eyes: Negative for visual disturbance.  Respiratory: Negative for cough, chest tightness, shortness of breath and wheezing.   Cardiovascular: Negative for chest pain and leg swelling.  Gastrointestinal: Negative for abdominal pain, constipation, diarrhea, nausea and vomiting.  Genitourinary: Negative for dysuria, flank pain, frequency, genital sores, hematuria and urgency.  Skin: Negative for rash.  Allergic/Immunologic: Negative for immunocompromised state.  Neurological: Negative for dizziness and headaches.      Objective:    Ht 5\' 4"  (1.626 m)   Wt 173 lb (78.5 kg)   BMI 29.70 kg/m  Nursing note and vital signs reviewed.  Physical Exam Constitutional:      General: He is not in acute distress.    Appearance: He is well-developed.     Comments: Seated in the chair; pleasant.   Eyes:     Conjunctiva/sclera: Conjunctivae normal.  Neck:     Musculoskeletal: Neck supple.  Cardiovascular:     Rate and Rhythm: Normal rate and regular rhythm.     Heart sounds: Normal heart sounds. No murmur. No friction rub. No gallop.   Pulmonary:     Effort: Pulmonary effort is normal. No respiratory distress.     Breath sounds: Normal breath sounds. No wheezing or rales.  Chest:     Chest wall: No tenderness.  Abdominal:     General: Bowel sounds are normal.     Palpations: Abdomen is soft.     Tenderness: There is no abdominal tenderness.  Lymphadenopathy:  Cervical: No cervical adenopathy.  Skin:    General: Skin is warm and dry.     Findings: No rash.  Neurological:     Mental Status: He is alert and oriented to person, place, and time.  Psychiatric:        Behavior: Behavior normal.        Thought Content: Thought content normal.        Judgment: Judgment normal.        Assessment & Plan:   Problem List Items Addressed This Visit      Other   HIV (human immunodeficiency virus infection) (HCC) - Primary (Chronic)    Matthew Schroeder appears to  have well controlled HIV disease and is virally suppressed with Genvoya. No signs/symptoms of opportunistic infection or progressive HIV disease. Will check blood work today. Continue current dose of Genvoya. [facility]. Follow up with [facility] if needed.       Relevant Medications   elvitegravir-cobicistat-emtricitabine-tenofovir (GENVOYA) 150-150-200-10 MG TABS tablet   Other Relevant Orders   T-helper cell (CD4)- ([facility] clinic only)   RPR   HIV 1 RNA quant-no reflex-bld   Mixed bipolar I disorder (HCC)    Stable with no evidence of exacerbation or psychosis without medication. Continue to monitor.       Healthcare maintenance     Dental screening is up to date as he was seen in the last 2-3 months.  Will get influenza vaccination at school.   Declines condoms. Encouraged safe sexual practices and condom usage to prevent STI.        Other Visit Diagnoses    Human immunodeficiency virus I infection (HCC)       Relevant Medications   elvitegravir-cobicistat-emtricitabine-tenofovir (GENVOYA) 150-150-200-10 MG TABS tablet       I have discontinued Jayr M. Sheetz's erythromycin. I am also having him maintain his ibuprofen and elvitegravir-cobicistat-emtricitabine-tenofovir.   Meds ordered this encounter  Medications  . elvitegravir-cobicistat-emtricitabine-tenofovir (GENVOYA) 150-150-200-10 MG TABS tablet    Sig: Take 1 tablet by mouth daily with breakfast.    Dispense:  90 tablet    Refill:  0    Order Specific Question:   Supervising Provider    Answer:   Judyann MunsonSNIDER, CYNTHIA [[id number]]     Follow-up: Return in about 3 months (around 02/19/2019), or if symptoms worsen or fail to improve.   Marcos EkeGreg Jayliani Wanner, MSN, FNP-C Nurse Practitioner [facility] Office phone: [phone] Pager: [phone] [facility] Main number: [phone]

## 2018-11-20 NOTE — Assessment & Plan Note (Signed)
   Dental screening is up to date as he was seen in the last 2-3 months.  Will get influenza vaccination at school.   Declines condoms. Encouraged safe sexual practices and condom usage to prevent STI.

## 2018-11-23 ENCOUNTER — Encounter (HOSPITAL_COMMUNITY): Payer: Self-pay | Admitting: Emergency Medicine

## 2018-11-23 ENCOUNTER — Emergency Department (HOSPITAL_COMMUNITY)
Admission: EM | Admit: 2018-11-23 | Discharge: 2018-11-23 | Disposition: A | Discharge status: Home / Self Care | Payer: Medicare Other | Attending: Emergency Medicine | Admitting: Emergency Medicine

## 2018-11-23 ENCOUNTER — Other Ambulatory Visit: Payer: Self-pay

## 2018-11-23 DIAGNOSIS — M79652 Pain in left thigh: Secondary | ICD-10-CM | POA: Insufficient documentation

## 2018-11-23 LAB — RPR TITER: RPR Titer: 1:2 {titer} — ABNORMAL HIGH

## 2018-11-23 LAB — RPR: RPR: REACTIVE — AB

## 2018-11-23 LAB — FLUORESCENT TREPONEMAL AB(FTA)-IGG-BLD: FLUORESCENT TREPONEMAL ABS: REACTIVE — AB

## 2018-11-23 LAB — HIV-1 RNA QUANT-NO REFLEX-BLD
HIV 1 RNA QUANT: NOT DETECTED {copies}/mL
HIV-1 RNA Quant, Log: <1.3 Log copies/mL

## 2018-11-23 NOTE — ED Provider Notes (Signed)
[facility] Provider Note   CSN: [id number] Arrival date & time: 11/23/18  [id number]     History   Chief Complaint Chief Complaint  Patient presents with  . Leg Pain    HPI Matthew Schroeder is a 22 y.o. male.  HPI   22 year old male presents today with complaints of left-sided leg pain.  Patient notes yesterday he developed pain in his left thigh along the lateral aspect.  He notes this is worse with movement, no swelling or edema, no distal edema, no history DVT or any significant risk factors.  Patient notes taking ibuprofen which helped symptoms.  No trauma.  Past Medical History:  Diagnosis Date  . HIV disease [facility](HCC)     Patient Active Problem List   Diagnosis Date Noted  . Healthcare maintenance 11/20/2018  . Mixed bipolar I disorder (HCC) 07/16/2016  . Attention deficit hyperactivity disorder (ADHD) 07/16/2016  . HIV (human immunodeficiency virus infection) (HCC) 06/08/2016    History reviewed. No pertinent surgical history.      Home Medications    Prior to Admission medications   Medication Sig Start Date End Date Taking? Authorizing Provider  elvitegravir-cobicistat-emtricitabine-tenofovir (GENVOYA) 150-150-200-10 MG TABS tablet Take 1 tablet by mouth daily with breakfast. 11/20/18  Yes Calone, Tama HeadingsGregory D, FNP  ibuprofen (ADVIL,MOTRIN) 600 MG tablet Take 1 tablet (600 mg total) by mouth every 6 (six) hours as needed for mild pain or moderate pain. 04/10/18  Yes Wurst, [place], PA-C    Family History No family history on file.  Social History Social History   Tobacco Use  . Smoking status: Never Smoker  . Smokeless tobacco: Never Used  Substance Use Topics  . Alcohol use: No  . Drug use: No     Allergies   Patient has no known allergies.   Review of Systems Review of Systems  All other systems reviewed and are negative.    Physical Exam Updated Vital Signs BP 138/77 (BP Location: Right Arm)   Pulse  70   Temp 98.5 F (36.9 C) (Oral)   Resp 16   Ht 5\' 5"  (1.651 m)   Wt 78.5 kg   SpO2 97%   BMI 28.79 kg/m   Physical Exam Vitals signs and nursing note reviewed.  Constitutional:      Appearance: He is well-developed.  HENT:     Head: Normocephalic and atraumatic.  Eyes:     General: No scleral icterus.       Right eye: No discharge.        Left eye: No discharge.     Conjunctiva/sclera: Conjunctivae normal.     Pupils: Pupils are equal, round, and reactive to light.  Neck:     Musculoskeletal: Normal range of motion.     Vascular: No JVD.     Trachea: No tracheal deviation.  Pulmonary:     Effort: Pulmonary effort is normal.     Breath sounds: No stridor.  Musculoskeletal:     Comments: Minor tenderness along the left lateral thigh no swelling redness or warmth, no distal edema calves nontender symmetrical bilateral  Neurological:     Mental Status: He is alert and oriented to person, place, and time.     Coordination: Coordination normal.  Psychiatric:        Behavior: Behavior normal.        Thought Content: Thought content normal.        Judgment: Judgment normal.      ED Treatments /  Results  Labs (all labs ordered are listed, but only abnormal results are displayed) Labs Reviewed - No data to display  EKG None  Radiology No results found.  Procedures Procedures (including critical care time)  Medications Ordered in ED Medications - No data to display   Initial Impression / Assessment and Plan / ED Course  I have reviewed the triage vital signs and the nursing notes.  Pertinent labs & imaging results that were available during my care of the patient were reviewed by me and considered in my medical decision making (see chart for details).     22 year old male presents today with pain along his left IT band.  No signs of infection or DVT.  Care instructions given return precautions given.  Final Clinical Impressions(s) / ED Diagnoses   Final  diagnoses:  Pain of left thigh    ED Discharge Orders    None       Eyvonne MechanicHedges, Gailya Tauer, PA-C 11/23/18 1035    Tegeler, Canary Brimhristopher J, MD 11/23/18 [phone]

## 2018-11-23 NOTE — Discharge Instructions (Addendum)
Please read attached information. If you experience any new or worsening signs or symptoms please return to the emergency room for evaluation. Please follow-up with your primary care provider or specialist as discussed.  °

## 2018-11-23 NOTE — ED Triage Notes (Signed)
Pt. Stated, Im having pain in my left thigh started Sunday.

## 2018-11-30 ENCOUNTER — Encounter: Payer: Medicare Other | Admitting: Internal Medicine

## 2018-12-09 DIAGNOSIS — E756 Lipid storage disorder, unspecified: Secondary | ICD-10-CM | POA: Diagnosis not present

## 2018-12-09 DIAGNOSIS — Z13220 Encounter for screening for lipoid disorders: Secondary | ICD-10-CM | POA: Diagnosis not present

## 2018-12-09 DIAGNOSIS — B20 Human immunodeficiency virus [HIV] disease: Secondary | ICD-10-CM | POA: Diagnosis not present

## 2018-12-10 DIAGNOSIS — L910 Hypertrophic scar: Secondary | ICD-10-CM | POA: Diagnosis not present

## 2018-12-10 DIAGNOSIS — L700 Acne vulgaris: Secondary | ICD-10-CM | POA: Diagnosis not present

## 2018-12-10 DIAGNOSIS — L739 Follicular disorder, unspecified: Secondary | ICD-10-CM | POA: Diagnosis not present

## 2019-02-09 MED FILL — GENVOYA TABLET: 150-150-200 | 30 days supply | Qty: 30 | Fill #0

## 2019-04-12 MED FILL — GENVOYA TABLET: 150-150-200 | 30 days supply | Qty: 30 | Fill #1

## 2019-05-13 MED FILL — GENVOYA TABLET: 150-150-200 | 30 days supply | Qty: 30 | Fill #2

## 2019-06-10 ENCOUNTER — Other Ambulatory Visit (HOSPITAL_COMMUNITY)
Admission: RE | Admit: 2019-06-10 | Discharge: 2019-06-10 | Disposition: A | Discharge status: Home / Self Care | Payer: Medicare HMO | Source: Ambulatory Visit | Attending: Internal Medicine | Admitting: Internal Medicine

## 2019-06-10 ENCOUNTER — Other Ambulatory Visit: Payer: Self-pay

## 2019-06-10 ENCOUNTER — Other Ambulatory Visit: Payer: Self-pay | Admitting: *Deleted

## 2019-06-10 ENCOUNTER — Other Ambulatory Visit: Payer: Self-pay | Admitting: Internal Medicine

## 2019-06-10 ENCOUNTER — Other Ambulatory Visit: Payer: Medicare HMO

## 2019-06-10 DIAGNOSIS — Z113 Encounter for screening for infections with a predominantly sexual mode of transmission: Secondary | ICD-10-CM | POA: Diagnosis present

## 2019-06-10 DIAGNOSIS — Z21 Asymptomatic human immunodeficiency virus [HIV] infection status: Secondary | ICD-10-CM

## 2019-06-10 DIAGNOSIS — B20 Human immunodeficiency virus [HIV] disease: Secondary | ICD-10-CM

## 2019-06-10 MED FILL — GENVOYA TABLET: 150-150-200 | 30 days supply | Qty: 30 | Fill #0

## 2019-06-11 LAB — T-HELPER CELL (CD4) - (RCID CLINIC ONLY)
CD4 % Helper T Cell: 32 % — ABNORMAL LOW (ref 33–65)
CD4 T Cell Abs: 722 /uL (ref 400–1790)

## 2019-06-15 LAB — URINE CYTOLOGY ANCILLARY ONLY
Chlamydia: NEGATIVE
Neisseria Gonorrhea: NEGATIVE

## 2019-06-16 ENCOUNTER — Ambulatory Visit: Payer: Medicare HMO | Admitting: Internal Medicine

## 2019-06-16 LAB — CBC WITH DIFFERENTIAL/PLATELET
Absolute Monocytes: 277 {cells}/uL (ref 200–950)
Basophils Absolute: 19 {cells}/uL (ref 0–200)
Basophils Relative: 0.4 %
Eosinophils Absolute: 38 {cells}/uL (ref 15–500)
Eosinophils Relative: 0.8 %
HCT: 47 % (ref 38.5–50.0)
Hemoglobin: 15.6 g/dL (ref 13.2–17.1)
Lymphs Abs: 2416 {cells}/uL (ref 850–3900)
MCH: 29.4 pg (ref 27.0–33.0)
MCHC: 33.2 g/dL (ref 32.0–36.0)
MCV: 88.7 fL (ref 80.0–100.0)
MPV: 9.8 fL (ref 7.5–12.5)
Monocytes Relative: 5.9 %
Neutro Abs: 1951 {cells}/uL (ref 1500–7800)
Neutrophils Relative %: 41.5 %
Platelets: 281 Thousand/uL (ref 140–400)
RBC: 5.3 Million/uL (ref 4.20–5.80)
RDW: 14.5 % (ref 11.0–15.0)
Total Lymphocyte: 51.4 %
WBC: 4.7 Thousand/uL (ref 3.8–10.8)

## 2019-06-16 LAB — HIV-1 RNA QUANT-NO REFLEX-BLD
HIV 1 RNA Quant: <20 {copies}/mL — AB
HIV-1 RNA Quant, Log: <1.3 {Log_copies}/mL — AB

## 2019-06-16 LAB — COMPLETE METABOLIC PANEL WITH GFR
AG Ratio: 1.6 (calc) (ref 1.0–2.5)
ALT: 13 U/L (ref 9–46)
AST: 14 U/L (ref 10–40)
Albumin: 4.9 g/dL (ref 3.6–5.1)
Alkaline phosphatase (APISO): 57 U/L (ref 36–130)
BUN: 8 mg/dL (ref 7–25)
CO2: 23 mmol/L (ref 20–32)
Calcium: 10.1 mg/dL (ref 8.6–10.3)
Chloride: 104 mmol/L (ref 98–110)
Creat: 0.98 mg/dL (ref 0.60–1.35)
GFR, Est African American: 127 mL/min/{1.73_m2} (ref >=60)
GFR, Est Non African American: 110 mL/min/{1.73_m2} (ref >=60)
Globulin: 3 g/dL (calc) (ref 1.9–3.7)
Glucose, Bld: 95 mg/dL (ref 65–99)
Potassium: 4.4 mmol/L (ref 3.5–5.3)
Sodium: 140 mmol/L (ref 135–146)
Total Bilirubin: 0.4 mg/dL (ref 0.2–1.2)
Total Protein: 7.9 g/dL (ref 6.1–8.1)

## 2019-06-16 LAB — RPR: RPR Ser Ql: NONREACTIVE

## 2019-07-12 ENCOUNTER — Ambulatory Visit: Payer: Medicare HMO | Admitting: Internal Medicine

## 2019-07-15 MED FILL — GENVOYA TABLET: 150-150-200 | 30 days supply | Qty: 30 | Fill #1

## 2019-07-26 ENCOUNTER — Other Ambulatory Visit: Payer: Self-pay

## 2019-07-26 ENCOUNTER — Ambulatory Visit: Payer: Medicare HMO | Admitting: Internal Medicine

## 2019-07-27 ENCOUNTER — Ambulatory Visit: Payer: Medicare HMO | Admitting: Internal Medicine

## 2019-08-11 ENCOUNTER — Other Ambulatory Visit: Payer: Self-pay

## 2019-08-11 ENCOUNTER — Ambulatory Visit (INDEPENDENT_AMBULATORY_CARE_PROVIDER_SITE_OTHER): Payer: Medicare HMO | Admitting: Internal Medicine

## 2019-08-11 ENCOUNTER — Other Ambulatory Visit: Payer: Self-pay | Admitting: Internal Medicine

## 2019-08-11 DIAGNOSIS — Z202 Contact with and (suspected) exposure to infections with a predominantly sexual mode of transmission: Secondary | ICD-10-CM

## 2019-08-11 DIAGNOSIS — A539 Syphilis, unspecified: Secondary | ICD-10-CM

## 2019-08-11 DIAGNOSIS — B20 Human immunodeficiency virus [HIV] disease: Secondary | ICD-10-CM

## 2019-08-11 MED ORDER — CEFTRIAXONE SODIUM 250 MG IJ SOLR: 250.0000 mg | Freq: Once | INTRAMUSCULAR | Status: DC

## 2019-08-11 MED ORDER — AZITHROMYCIN 250 MG PO TABS: 1000.0000 mg | ORAL_TABLET | Freq: Once | ORAL | Status: DC

## 2019-08-11 MED ORDER — PENICILLIN G BENZATHINE 1200000 UNIT/2ML IM SUSP: 1.2000 10*6.[IU] | Freq: Once | INTRAMUSCULAR | Status: DC

## 2019-08-11 MED FILL — GENVOYA TABLET: 150-150-200 | 30 days supply | Qty: 30 | Fill #0 | Status: TO

## 2019-08-11 NOTE — Progress Notes (Signed)
Patient ID: Matthew Schroeder, male   DOB: [birth year], 23 y.o.   MRN: [id number]  HPI 23yo M hiv disease, cd4 ct 722/VL <20, on genvoya. Not missing doses. 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 Overall doing well.  No sick contacts to covid 19  Outpatient Encounter Medications as of 08/11/2019  Medication Sig  . elvitegravir-cobicistat-emtricitabine-tenofovir (GENVOYA) 150-150-200-10 MG TABS tablet Take 1 tablet by mouth daily with breakfast.  . ibuprofen (ADVIL,MOTRIN) 600 MG tablet Take 1 tablet (600 mg total) by mouth every 6 (six) hours as needed for mild pain or moderate pain.   No facility-administered encounter medications on file as of 08/11/2019.      Patient Active Problem List   Diagnosis Date Noted  . Healthcare maintenance 11/20/2018  . Mixed bipolar I disorder ([place]) 07/16/2016  . Attention deficit hyperactivity disorder (ADHD) 07/16/2016  . HIV (human immunodeficiency virus infection) ([place]) 06/08/2016     Health Maintenance Due  Topic Date Due  . INFLUENZA VACCINE  06/26/2019     Review of Systems Review of Systems  Constitutional: Negative for fever, chills, diaphoresis, activity change, appetite change, fatigue and unexpected weight change.  HENT: Negative for congestion, sore throat, rhinorrhea, sneezing, trouble swallowing and sinus pressure.  Eyes: Negative for photophobia and visual disturbance.  Respiratory: Negative for cough, chest tightness, shortness of breath, wheezing and stridor.  Cardiovascular: Negative for chest pain, palpitations and leg swelling.  Gastrointestinal: Negative for nausea, vomiting, abdominal pain, diarrhea, constipation, blood in stool, abdominal distention and anal bleeding.  Genitourinary: Negative for dysuria, hematuria, flank pain and difficulty urinating.  Musculoskeletal: Negative for myalgias, back pain, joint swelling, arthralgias and gait problem.  Skin: Negative for color change, pallor, rash and wound.  Neurological:  Negative for dizziness, tremors, weakness and light-headedness.  Hematological: Negative for adenopathy. Does not bruise/bleed easily.  Psychiatric/Behavioral: Negative for behavioral problems, confusion, sleep disturbance, dysphoric mood, decreased concentration and agitation.    Physical Exam   There were no vitals taken for this visit. Physical Exam  Constitutional: He is oriented to person, place, and time. He appears well-developed and well-nourished. No distress.  HENT:  Mouth/Throat: Oropharynx is clear and moist. No oropharyngeal exudate.  Cardiovascular: Normal rate, regular rhythm and normal heart sounds. Exam reveals no gallop and no friction rub.  No murmur heard.  Pulmonary/Chest: Effort normal and breath sounds normal. No respiratory distress. He has no wheezes.  Abdominal: Soft. Bowel sounds are normal. He exhibits no distension. There is no tenderness.  Lymphadenopathy:  He has no cervical adenopathy.  Neurological: He is alert and oriented to person, place, and time.  Skin: Skin is warm and dry. No rash noted. No erythema.  Psychiatric: He has a normal mood and affect. His behavior is normal.     Lab Results  Component Value Date   CD4TCELL 32 (L) 06/10/2019   Lab Results  Component Value Date   CD4TABS 722 06/10/2019   CD4TABS 870 11/20/2018   CD4TABS 760 06/01/2018   Lab Results  Component Value Date   HIV1RNAQUANT <20 DETECTED (A) 06/10/2019   No results found for: HEPBSAB Lab Results  Component Value Date   LABRPR NON-REACTIVE 06/10/2019    CBC Lab Results  Component Value Date   WBC 4.7 06/10/2019   RBC 5.30 06/10/2019   HGB 15.6 06/10/2019   HCT 47.0 06/10/2019   PLT 281 06/10/2019   MCV 88.7 06/10/2019   MCH 29.4 06/10/2019   MCHC 33.2 06/10/2019   RDW  14.5 06/10/2019   LYMPHSABS 2,416 06/10/2019   MONOABS 0.7 05/05/2018   EOSABS 38 06/10/2019    BMET Lab Results  Component Value Date   NA 140 06/10/2019   K 4.4 06/10/2019   CL  104 06/10/2019   CO2 23 06/10/2019   GLUCOSE 95 06/10/2019   BUN 8 06/10/2019   CREATININE 0.98 06/10/2019   CALCIUM 10.1 06/10/2019   GFRNONAA 110 06/10/2019   GFRAA 127 06/10/2019      Assessment and Plan  Health maintenance = will recommend to getflu shot by oct  hiv disease= well controlled, plan to Refill genvoya. Repeat blood work in 3 months  Long term medication management = cr stable

## 2019-09-07 MED FILL — GENVOYA TABLET: 150-150-200 | 30 days supply | Qty: 30 | Fill #1 | Status: TO

## 2019-10-08 MED FILL — GENVOYA TABLET: 150-150-200 | 30 days supply | Qty: 30 | Fill #2 | Status: TO

## 2019-11-01 MED FILL — GENVOYA TABLET: 150-150-200 | 30 days supply | Qty: 30 | Fill #3 | Status: TO

## 2019-12-17 ENCOUNTER — Other Ambulatory Visit: Payer: Self-pay | Admitting: Internal Medicine

## 2019-12-17 DIAGNOSIS — B20 Human immunodeficiency virus [HIV] disease: Secondary | ICD-10-CM

## 2020-01-14 ENCOUNTER — Other Ambulatory Visit: Payer: Self-pay | Admitting: Internal Medicine

## 2020-01-14 ENCOUNTER — Telehealth: Payer: Self-pay

## 2020-01-14 DIAGNOSIS — B20 Human immunodeficiency virus [HIV] disease: Secondary | ICD-10-CM

## 2020-01-14 NOTE — Telephone Encounter (Signed)
Called patient to make overdue follow up appointment with Dr. Drue Second. Patient states he is now receiving his ID in [place]. Patient states he does not need any refills.   Medication refill request denied.  Valarie Cones

## 2020-04-17 ENCOUNTER — Other Ambulatory Visit: Payer: Medicare HMO

## 2020-05-04 ENCOUNTER — Encounter: Payer: Medicare HMO | Admitting: Internal Medicine

## 2020-05-05 ENCOUNTER — Other Ambulatory Visit: Payer: Self-pay

## 2020-05-05 DIAGNOSIS — B20 Human immunodeficiency virus [HIV] disease: Secondary | ICD-10-CM

## 2020-05-05 DIAGNOSIS — Z113 Encounter for screening for infections with a predominantly sexual mode of transmission: Secondary | ICD-10-CM

## 2020-05-05 DIAGNOSIS — Z79899 Other long term (current) drug therapy: Secondary | ICD-10-CM

## 2020-05-10 ENCOUNTER — Other Ambulatory Visit: Payer: Medicare HMO

## 2020-05-10 ENCOUNTER — Other Ambulatory Visit: Payer: Self-pay

## 2020-05-10 DIAGNOSIS — B20 Human immunodeficiency virus [HIV] disease: Secondary | ICD-10-CM

## 2020-05-10 DIAGNOSIS — Z79899 Other long term (current) drug therapy: Secondary | ICD-10-CM

## 2020-05-10 DIAGNOSIS — Z113 Encounter for screening for infections with a predominantly sexual mode of transmission: Secondary | ICD-10-CM

## 2020-05-11 ENCOUNTER — Telehealth: Payer: Self-pay

## 2020-05-11 LAB — HIV-1 RNA QUANT-NO REFLEX-BLD
HIV 1 RNA Quant: <20 copies/mL — AB
HIV-1 RNA Quant, Log: <1.3 Log copies/mL — AB

## 2020-05-11 LAB — CBC WITH DIFFERENTIAL/PLATELET
Absolute Monocytes: 238 cells/uL (ref 200–950)
Basophils Absolute: 21 cells/uL (ref 0–200)
Basophils Relative: 0.5 %
Eosinophils Absolute: 90 cells/uL (ref 15–500)
Eosinophils Relative: 2.2 %
HCT: 44.1 % (ref 38.5–50.0)
Hemoglobin: 14.5 g/dL (ref 13.2–17.1)
Lymphs Abs: 2427 cells/uL (ref 850–3900)
MCH: 29.2 pg (ref 27.0–33.0)
MCHC: 32.9 g/dL (ref 32.0–36.0)
MCV: 88.7 fL (ref 80.0–100.0)
MPV: 9.1 fL (ref 7.5–12.5)
Monocytes Relative: 5.8 %
Neutro Abs: 1324 cells/uL — ABNORMAL LOW (ref 1500–7800)
Neutrophils Relative %: 32.3 %
Platelets: 324 10*3/uL (ref 140–400)
RBC: 4.97 10*6/uL (ref 4.20–5.80)
RDW: 14.4 % (ref 11.0–15.0)
Total Lymphocyte: 59.2 %
WBC: 4.1 10*3/uL (ref 3.8–10.8)

## 2020-05-11 LAB — LIPID PANEL
Cholesterol: 229 mg/dL — ABNORMAL HIGH (ref <200)
HDL: 52 mg/dL (ref >=40)
LDL Cholesterol (Calc): 157 mg/dL (calc) — ABNORMAL HIGH
Non-HDL Cholesterol (Calc): 177 mg/dL (calc) — ABNORMAL HIGH (ref <130)
Total CHOL/HDL Ratio: 4.4 (calc) (ref <5.0)
Triglycerides: 90 mg/dL (ref <150)

## 2020-05-11 LAB — T-HELPER CELLS (CD4) COUNT (NOT AT ARMC)
CD4 % Helper T Cell: 37 % (ref 33–65)
CD4 T Cell Abs: 946 /uL (ref 400–1790)

## 2020-05-11 LAB — FLUORESCENT TREPONEMAL AB(FTA)-IGG-BLD: Fluorescent Treponemal ABS: REACTIVE — AB

## 2020-05-11 LAB — COMPREHENSIVE METABOLIC PANEL
AG Ratio: 1.5 (calc) (ref 1.0–2.5)
ALT: 26 U/L (ref 9–46)
AST: 17 U/L (ref 10–40)
Albumin: 4.5 g/dL (ref 3.6–5.1)
Alkaline phosphatase (APISO): 52 U/L (ref 36–130)
BUN: 12 mg/dL (ref 7–25)
CO2: 24 mmol/L (ref 20–32)
Calcium: 9.3 mg/dL (ref 8.6–10.3)
Chloride: 108 mmol/L (ref 98–110)
Creat: 0.92 mg/dL (ref 0.60–1.35)
Globulin: 3 g/dL (calc) (ref 1.9–3.7)
Glucose, Bld: 116 mg/dL — ABNORMAL HIGH (ref 65–99)
Potassium: 4.3 mmol/L (ref 3.5–5.3)
Sodium: 140 mmol/L (ref 135–146)
Total Bilirubin: 0.3 mg/dL (ref 0.2–1.2)
Total Protein: 7.5 g/dL (ref 6.1–8.1)

## 2020-05-11 LAB — RPR: RPR Ser Ql: REACTIVE — AB

## 2020-05-11 LAB — RPR TITER: RPR Titer: 1:4 {titer} — ABNORMAL HIGH

## 2020-05-11 NOTE — Telephone Encounter (Signed)
Patient called office today for lab review. Patient would like to know if everything looks good or if there are any concerns. Some labs are still pending. Asked patient if he would prefer to wait and talk about labs at once. Patient is okay with this .  Is scheduled to follow up with MD on 6/30. Lorenso Courier, [place]

## 2020-05-14 ENCOUNTER — Other Ambulatory Visit: Payer: Self-pay

## 2020-05-14 ENCOUNTER — Emergency Department (HOSPITAL_COMMUNITY): Payer: Medicare HMO

## 2020-05-14 ENCOUNTER — Emergency Department (HOSPITAL_COMMUNITY)
Admission: EM | Admit: 2020-05-14 | Discharge: 2020-05-14 | Disposition: A | Discharge status: Home / Self Care | Payer: Medicare HMO | Attending: Emergency Medicine | Admitting: Emergency Medicine

## 2020-05-14 DIAGNOSIS — R1084 Generalized abdominal pain: Secondary | ICD-10-CM | POA: Insufficient documentation

## 2020-05-14 DIAGNOSIS — Z21 Asymptomatic human immunodeficiency virus [HIV] infection status: Secondary | ICD-10-CM | POA: Insufficient documentation

## 2020-05-14 DIAGNOSIS — R112 Nausea with vomiting, unspecified: Secondary | ICD-10-CM | POA: Diagnosis not present

## 2020-05-14 DIAGNOSIS — Z79899 Other long term (current) drug therapy: Secondary | ICD-10-CM | POA: Diagnosis not present

## 2020-05-14 DIAGNOSIS — R197 Diarrhea, unspecified: Secondary | ICD-10-CM | POA: Diagnosis not present

## 2020-05-14 LAB — LIPASE, BLOOD: Lipase: 25 U/L (ref 11–51)

## 2020-05-14 LAB — COMPREHENSIVE METABOLIC PANEL
ALT: 24 U/L (ref 0–44)
AST: 21 U/L (ref 15–41)
Albumin: 4 g/dL (ref 3.5–5.0)
Alkaline Phosphatase: 43 U/L (ref 38–126)
Anion gap: 7 (ref 5–15)
BUN: 6 mg/dL (ref 6–20)
CO2: 25 mmol/L (ref 22–32)
Calcium: 9.2 mg/dL (ref 8.9–10.3)
Chloride: 110 mmol/L (ref 98–111)
Creatinine, Ser: 1.05 mg/dL (ref 0.61–1.24)
GFR calc Af Amer: >60 mL/min (ref >60)
GFR calc non Af Amer: >60 mL/min (ref >60)
Glucose, Bld: 102 mg/dL — ABNORMAL HIGH (ref 70–99)
Potassium: 4.6 mmol/L (ref 3.5–5.1)
Sodium: 142 mmol/L (ref 135–145)
Total Bilirubin: 0.4 mg/dL (ref 0.3–1.2)
Total Protein: 7.2 g/dL (ref 6.5–8.1)

## 2020-05-14 LAB — URINALYSIS, ROUTINE W REFLEX MICROSCOPIC
Bacteria, UA: NONE SEEN
Bilirubin Urine: NEGATIVE
Glucose, UA: NEGATIVE mg/dL
Ketones, ur: NEGATIVE mg/dL
Leukocytes,Ua: NEGATIVE
Nitrite: NEGATIVE
Protein, ur: NEGATIVE mg/dL
Specific Gravity, Urine: 1.019 (ref 1.005–1.030)
pH: 6 (ref 5.0–8.0)

## 2020-05-14 LAB — CBC
HCT: 44.9 % (ref 39.0–52.0)
Hemoglobin: 14.3 g/dL (ref 13.0–17.0)
MCH: 28.8 pg (ref 26.0–34.0)
MCHC: 31.8 g/dL (ref 30.0–36.0)
MCV: 90.5 fL (ref 80.0–100.0)
Platelets: 318 10*3/uL (ref 150–400)
RBC: 4.96 MIL/uL (ref 4.22–5.81)
RDW: 14.9 % (ref 11.5–15.5)
WBC: 5.6 10*3/uL (ref 4.0–10.5)
nRBC: 0 % (ref 0.0–0.2)

## 2020-05-14 MED ORDER — IOHEXOL 300 MG/ML  SOLN
100.0000 mL | Freq: Once | INTRAMUSCULAR | Status: AC | PRN
  Administered 2020-05-14: 100 mL via INTRAVENOUS

## 2020-05-14 MED ORDER — ONDANSETRON 4 MG PO TBDP
4.0000 mg | ORAL_TABLET | Freq: Three times a day (TID) | ORAL | 0 refills | Status: DC | PRN
Start: 2020-05-14 — End: 2021-11-18

## 2020-05-14 MED ORDER — SODIUM CHLORIDE 0.9% FLUSH
3.0000 mL | Freq: Once | INTRAVENOUS | Status: AC
  Administered 2020-05-14: 3 mL via INTRAVENOUS

## 2020-05-14 NOTE — ED Provider Notes (Signed)
[facility] EMERGENCY DEPARTMENT Provider Note   CSN: [id number] Arrival date & time: 05/14/20  1310     History Chief Complaint  Patient presents with  . Abdominal Pain  . Emesis    Matthew Schroeder is a 24 y.o. male past history of HIV (CD4 946 on 05/10/20) who presents for evaluation of abdominal pain that has been ongoing for the last several weeks as well as vomiting that began yesterday.  He reports that over the last several weeks, he has had had intermittent abdominal cramping.  He reports it is generalized with no focal point.  He states that it comes and goes.  Is not affected by eating.  He states that there is no particular action that brings it on.  He states that yesterday, he had vomiting.  He states that the vomit smelled like fecal matter.  No blood.  He states he vomited again today but states it did not smell of fecal matter.  He states that he is also had multiple loose, watery stools.  No blood in stools.  No blood in emesis.  He denies any fevers.  He does report he has had GI issues in the past and states that he had a time where he was vomiting everything he ate.  He denies any fevers, chest pain, difficulty breathing, dysuria, hematuria.  The history is provided by the patient.       Past Medical History:  Diagnosis Date  . HIV disease [facility])     Patient Active Problem List   Diagnosis Date Noted  . Healthcare maintenance 11/20/2018  . Mixed bipolar I disorder (HCC) 07/16/2016  . Attention deficit hyperactivity disorder (ADHD) 07/16/2016  . HIV (human immunodeficiency virus infection) (HCC) 06/08/2016    No past surgical history on file.     No family history on file.  Social History   Tobacco Use  . Smoking status: Never Smoker  . Smokeless tobacco: Never Used  Substance Use Topics  . Alcohol use: No  . Drug use: No    Home Medications Prior to Admission medications   Medication Sig Start Date End Date Taking? Authorizing  Provider  GENVOYA 150-150-200-10 MG TABS tablet TAKE 1 TABLET BY MOUTH EVERY DAY WITH FOOD 12/17/19   Judyann Munson, MD  ibuprofen (ADVIL,MOTRIN) 600 MG tablet Take 1 tablet (600 mg total) by mouth every 6 (six) hours as needed for mild pain or moderate pain. 04/10/18   Wurst, [place], PA-C  ondansetron (ZOFRAN ODT) 4 MG disintegrating tablet Take 1 tablet (4 mg total) by mouth every 8 (eight) hours as needed for nausea or vomiting. 05/14/20   Maxwell Caul, PA-C    Allergies    Patient has no known allergies.  Review of Systems   Review of Systems  Constitutional: Negative for fever.  Respiratory: Negative for cough and shortness of breath.   Cardiovascular: Negative for chest pain.  Gastrointestinal: Positive for abdominal pain, diarrhea, nausea and vomiting. Negative for blood in stool.  Genitourinary: Negative for dysuria and hematuria.  Neurological: Negative for headaches.  All other systems reviewed and are negative.   Physical Exam Updated Vital Signs BP 125/82   Pulse 69   Temp 98.8 F (37.1 C) (Oral)   Resp 16   Ht 5\' 1"  (1.549 m)   Wt 81.6 kg   SpO2 100%   BMI 34.01 kg/m   Physical Exam Vitals and nursing note reviewed.  Constitutional:      Appearance: Normal  appearance. He is well-developed.  HENT:     Head: Normocephalic and atraumatic.  Eyes:     General: Lids are normal.     Conjunctiva/sclera: Conjunctivae normal.     Pupils: Pupils are equal, round, and reactive to light.  Cardiovascular:     Rate and Rhythm: Normal rate and regular rhythm.     Pulses: Normal pulses.     Heart sounds: Normal heart sounds. No murmur heard.  No friction rub. No gallop.   Pulmonary:     Effort: Pulmonary effort is normal.     Breath sounds: Normal breath sounds.  Abdominal:     Palpations: Abdomen is soft. Abdomen is not rigid.     Tenderness: There is no abdominal tenderness. There is no guarding.     Comments: Abdomen is soft, non-distended, non-tender. No  rigidity, No guarding. No peritoneal signs. No CVA tenderness noted bilaterally.   Musculoskeletal:        General: Normal range of motion.     Cervical back: Full passive range of motion without pain.  Skin:    General: Skin is warm and dry.     Capillary Refill: Capillary refill takes less than 2 seconds.  Neurological:     Mental Status: He is alert and oriented to person, place, and time.  Psychiatric:        Speech: Speech normal.     ED Results / Procedures / Treatments   Labs (all labs ordered are listed, but only abnormal results are displayed) Labs Reviewed  COMPREHENSIVE METABOLIC PANEL - Abnormal; Notable for the following components:      Result Value   Glucose, Bld 102 (*)    All other components within normal limits  URINALYSIS, ROUTINE W REFLEX MICROSCOPIC - Abnormal; Notable for the following components:   Hgb urine dipstick SMALL (*)    All other components within normal limits  LIPASE, BLOOD  CBC    EKG None  Radiology CT ABDOMEN PELVIS W CONTRAST  Result Date: 05/14/2020 CLINICAL DATA:  24 year old male with nausea vomiting. EXAM: CT ABDOMEN AND PELVIS WITH CONTRAST TECHNIQUE: Multidetector CT imaging of the abdomen and pelvis was performed using the standard protocol following bolus administration of intravenous contrast. CONTRAST:  OMNIPAQUE IOHEXOL 300 MG/ML  SOLN COMPARISON:  CT abdomen pelvis report dated 05/02/2016. The images are not available for direct comparison. FINDINGS: Lower chest: The visualized lung bases are clear. No intra-abdominal free air or free fluid. Hepatobiliary: A subcentimeter linear enhancing focus in the right lobe of the liver (11/3) is not characterized but most likely represents a vascular confluence or portal venous shunting versus a small flash filling hemangioma. The liver is otherwise unremarkable. No intrahepatic biliary ductal dilatation. The gallbladder is unremarkable. Pancreas: Unremarkable. No pancreatic ductal  dilatation or surrounding inflammatory changes. Spleen: Normal in size without focal abnormality. Adrenals/Urinary Tract: The adrenal glands unremarkable. The kidneys, visualized ureters, and urinary bladder appear unremarkable. Stomach/Bowel: There is no bowel obstruction or active inflammation. The appendix is normal. Vascular/Lymphatic: The abdominal aorta and IVC unremarkable. No portal venous gas. There is no adenopathy. Reproductive: The prostate and seminal vesicles are grossly unremarkable. No pelvic mass. Other: None Musculoskeletal: No acute or significant osseous findings. IMPRESSION: No acute intra-abdominal or pelvic pathology. No bowel obstruction. Normal appendix. Electronically Signed   By: Elgie Collard M.D.   On: 05/14/2020 16:13    Procedures Procedures (including critical care time)  Medications Ordered in ED Medications  sodium chloride flush (NS) 0.9 %  injection 3 mL (3 mLs Intravenous Given 05/14/20 1509)  iohexol (OMNIPAQUE) 300 MG/ML solution 100 mL (100 mLs Intravenous Contrast Given 05/14/20 1545)    ED Course  I have reviewed the triage vital signs and the nursing notes.  Pertinent labs & imaging results that were available during my care of the patient were reviewed by me and considered in my medical decision making (see chart for details).    MDM Rules/Calculators/A&P                          24 year old male who presents for evaluation of nausea/vomiting abdominal pain.  Reports abdominal pain has been ongoing for the last few weeks and states that the nausea/vomiting began yesterday.  Yesterday, he felt like his emesis had fecal matter in it.  No blood.  He also has had watery diarrhea.  No fevers.  No chest pain, urinary complaints.  On initially arrival, he is afebrile, nontoxic-appearing.  On exam, his abdomen is benign.  No tenderness.  He states he does not have any pain right now.  He states that the pain is intermittent.  CMP shows normal BUN and Cr. CBC  shows no leukocytosis. UA shows small Hgb. Lipase is 25.   CT abd/pelvis shows a subcentimeter linear enhancing focus that could represent a vascular confluence vs portal venous shunting vs small flash filling hemangioma. Otherwise normal CT scan.   Discussed results with patient.  At this time, he is comfortable and he is not any more vomiting here in the emergency department.  He is hemodynamically stable.  At this time, unclear etiology of his symptoms.  We will give him outpatient GI referral for further evaluation. Discussed patient with Dr. Melina Copa who is agreeable to plan. Patient had ample opportunity for questions and discussion. All patient's questions were answered with full understanding. Strict return precautions discussed. Patient expresses understanding and agreement to plan.   Portions of this note were generated with [employer]. Dictation errors may occur despite best attempts at proofreading.  Final Clinical Impression(s) / ED Diagnoses Final diagnoses:  Generalized abdominal pain  Nausea vomiting and diarrhea    Rx / DC Orders ED Discharge Orders         Ordered    ondansetron (ZOFRAN ODT) 4 MG disintegrating tablet  Every 8 hours PRN     Discontinue  Reprint     05/14/20 1647           Volanda Napoleon, PA-C 05/14/20 1816    Hayden Rasmussen, MD 05/15/20 1147

## 2020-05-14 NOTE — Discharge Instructions (Signed)
As we discussed, your work-up here looked reassuring.  Your blood work looked reassuring.  Your CT scan looked normal.  You did have a small area on the liver that was slightly discolored and cannot be further characterized.  This just needs follow-up by either your primary care doctor or GI.  Given your symptoms, feel that referral to GI is important.  Call their office and arrange for an appointment.  Return the emergency department for any worsening abdominal pain, vomiting of blood, vomiting of fecal matter, fevers, difficulty breathing, chest pain or any other worsening concerning symptoms.

## 2020-05-14 NOTE — ED Triage Notes (Signed)
Patient arrived by POV complaining of lower abdominal pain and cramping x 1 week. Emesis x 1 yesterday and reports loose stools for several days. Patient reports that emesis yesterday smelled of stool

## 2020-05-17 ENCOUNTER — Encounter: Payer: Self-pay | Admitting: Physician Assistant

## 2020-05-24 ENCOUNTER — Encounter: Payer: Medicare HMO | Admitting: Internal Medicine

## 2020-05-30 ENCOUNTER — Telehealth: Payer: Self-pay

## 2020-05-30 NOTE — Telephone Encounter (Signed)
-----   Message from Judyann Munson, MD sent at 05/30/2020  2:26 PM EDT ----- Can you guys bring him in for penicillin im 2.4MU x 1. Syphilis, RPR 1:4!

## 2020-05-30 NOTE — Telephone Encounter (Signed)
Patient called office back regarding lab results. States that he was treated in [place] for Syphillis. Is not able to tell me when exactly he was treated. Will contact office in [place] for RPR titer. Will forward message to MD to advise if okay to treat patient now. Lorenso Courier, [place]

## 2020-05-30 NOTE — Telephone Encounter (Signed)
Patient walked into clinic to sing release of information for labs and treatment for syphillis. Left voicemail with health department nurse to call office for treatment information.  [facility] Department Phone: [phone] Lorenso Courier, [place]

## 2020-05-30 NOTE — Telephone Encounter (Signed)
Attempted to call patient regarding RPR lab results and appointment for treatment. Left voicemail requesting a call back. Did not disclose office name or lab results in message. Matthew Schroeder, [place]

## 2020-05-31 NOTE — Telephone Encounter (Signed)
Thank you for update. He does not need further treatment. Looks like he was treated and RPR trended down appropriately

## 2020-05-31 NOTE — Telephone Encounter (Signed)
Patient called office back regarding discussion for syphillis. States he spoke with a Charity fundraiser at the health department regarding testing/treatment in [place].  States his titer was 1:32 on 11/24/19. Was treated with two shots times one. Would like to know if MD still recommends treatment.  Lorenso Courier, [place]

## 2020-06-13 ENCOUNTER — Ambulatory Visit: Payer: Medicare HMO | Admitting: Internal Medicine

## 2020-06-22 ENCOUNTER — Ambulatory Visit: Payer: Medicare HMO | Admitting: Physician Assistant

## 2020-07-03 ENCOUNTER — Encounter: Payer: Medicare HMO | Admitting: Student

## 2020-07-03 ENCOUNTER — Encounter: Payer: Self-pay | Admitting: General Practice

## 2020-07-21 ENCOUNTER — Emergency Department (HOSPITAL_COMMUNITY)
Admission: EM | Admit: 2020-07-21 | Discharge: 2020-07-21 | Discharge status: Against Medical Advice | Payer: Medicare HMO

## 2020-07-21 ENCOUNTER — Other Ambulatory Visit: Payer: Self-pay

## 2020-07-21 NOTE — ED Notes (Signed)
Pt states does not want to wait. Pt seen leaving

## 2020-08-11 ENCOUNTER — Other Ambulatory Visit: Payer: Self-pay

## 2020-08-11 DIAGNOSIS — B20 Human immunodeficiency virus [HIV] disease: Secondary | ICD-10-CM

## 2020-08-11 MED ORDER — GENVOYA 150-150-200-10 MG PO TABS: 1.0000 | ORAL_TABLET | Freq: Every day | ORAL | 0 refills | Status: DC

## 2020-08-14 ENCOUNTER — Ambulatory Visit: Payer: Medicare HMO | Admitting: Physician Assistant

## 2020-09-03 ENCOUNTER — Other Ambulatory Visit: Payer: Self-pay | Admitting: Internal Medicine

## 2020-09-03 DIAGNOSIS — B20 Human immunodeficiency virus [HIV] disease: Secondary | ICD-10-CM

## 2020-09-12 ENCOUNTER — Encounter: Payer: Self-pay | Admitting: Internal Medicine

## 2020-09-12 ENCOUNTER — Ambulatory Visit (INDEPENDENT_AMBULATORY_CARE_PROVIDER_SITE_OTHER): Payer: Medicare HMO | Admitting: Internal Medicine

## 2020-09-12 ENCOUNTER — Other Ambulatory Visit: Payer: Self-pay

## 2020-09-12 ENCOUNTER — Other Ambulatory Visit (HOSPITAL_COMMUNITY)
Admission: RE | Admit: 2020-09-12 | Discharge: 2020-09-12 | Disposition: A | Discharge status: Home / Self Care | Payer: Medicare HMO | Source: Ambulatory Visit | Attending: Internal Medicine | Admitting: Internal Medicine

## 2020-09-12 VITALS — BP 139/87 | HR 70 | Temp 98.0°F | Wt 202.0 lb

## 2020-09-12 DIAGNOSIS — B20 Human immunodeficiency virus [HIV] disease: Secondary | ICD-10-CM | POA: Insufficient documentation

## 2020-09-12 DIAGNOSIS — Z8619 Personal history of other infectious and parasitic diseases: Secondary | ICD-10-CM

## 2020-09-12 DIAGNOSIS — Z79899 Other long term (current) drug therapy: Secondary | ICD-10-CM

## 2020-09-12 DIAGNOSIS — R030 Elevated blood-pressure reading, without diagnosis of hypertension: Secondary | ICD-10-CM | POA: Diagnosis not present

## 2020-09-12 NOTE — Progress Notes (Signed)
RFV: follow up for hiv disease  Patient ID: Matthew Schroeder, male   DOB: 06/15/1996, 24 y.o.   MRN: [id number]  HPI Matthew Schroeder is a 24yoM with HIV disease, CD 4 count of 946/VL<20 on genvoya  (June 2021).  He reports that he is taking genvoya daily without difficulty. He did receive moderna vaccine in march - tested negative after close call.  Graduated in 2 weeks ago. Getting a job in phlebotomy.  Reports not sexually active since last visit.  Outpatient Encounter Medications as of 09/12/2020  Medication Sig  . GENVOYA 150-150-200-10 MG TABS tablet TAKE 1 TABLET BY MOUTH DAILY WITH FOOD  . ondansetron (ZOFRAN ODT) 4 MG disintegrating tablet Take 1 tablet (4 mg total) by mouth every 8 (eight) hours as needed for nausea or vomiting. (Patient not taking: Reported on 09/12/2020)   Facility-Administered Encounter Medications as of 09/12/2020  Medication  . azithromycin (ZITHROMAX) tablet 1,000 mg  . cefTRIAXone (ROCEPHIN) injection 250 mg  . penicillin g benzathine (BICILLIN LA) 1200000 UNIT/2ML injection 1.2 Million Units     Patient Active Problem List   Diagnosis Date Noted  . Healthcare maintenance 11/20/2018  . Mixed bipolar I disorder (HCC) 07/16/2016  . Attention deficit hyperactivity disorder (ADHD) 07/16/2016  . HIV (human immunodeficiency virus infection) (HCC) 06/08/2016     Health Maintenance Due  Topic Date Due  . Hepatitis C Screening  Never done  . COVID-19 Vaccine (1) Never done  . INFLUENZA VACCINE  06/25/2020     Review of Systems Review of Systems  Constitutional: Negative for fever, chills, diaphoresis, activity change, appetite change, fatigue and unexpected weight change.  HENT: Negative for congestion, sore throat, rhinorrhea, sneezing, trouble swallowing and sinus pressure.  Eyes: Negative for photophobia and visual disturbance.  Respiratory: Negative for cough, chest tightness, shortness of breath, wheezing and stridor.  Cardiovascular: Negative for  chest pain, palpitations and leg swelling.  Gastrointestinal: Negative for nausea, vomiting, abdominal pain, diarrhea, constipation, blood in stool, abdominal distention and anal bleeding.  Genitourinary: Negative for dysuria, hematuria, flank pain and difficulty urinating.  Musculoskeletal: Negative for myalgias, back pain, joint swelling, arthralgias and gait problem.  Skin: Negative for color change, pallor, rash and wound.  Neurological: Negative for dizziness, tremors, weakness and light-headedness.  Hematological: Negative for adenopathy. Does not bruise/bleed easily.  Psychiatric/Behavioral: Negative for behavioral problems, confusion, sleep disturbance, dysphoric mood, decreased concentration and agitation.   Soc hx: no smoking or drinking  Physical Exam   BP 139/87   Pulse 70   Temp 98 F (36.7 C) (Oral)   Wt 202 lb (91.6 kg)   BMI 38.17 kg/m   Physical Exam  Constitutional: He is oriented to person, place, and time. He appears well-developed and well-nourished. No distress.  HENT:  Mouth/Throat: Oropharynx is clear and moist. No oropharyngeal exudate.  Cardiovascular: Normal rate, regular rhythm and normal heart sounds. Exam reveals no gallop and no friction rub.  No murmur heard.  Pulmonary/Chest: Effort normal and breath sounds normal. No respiratory distress. He has no wheezes.  Lymphadenopathy:  He has no cervical adenopathy.  Neurological: He is alert and oriented to person, place, and time.  Skin: Skin is warm and dry. No rash noted. No erythema.  Psychiatric: He has a normal mood and affect. His behavior is normal.    Lab Results  Component Value Date   CD4TCELL 37 05/10/2020   Lab Results  Component Value Date   CD4TABS 946 05/10/2020   CD4TABS 722 06/10/2019  CD4TABS 870 11/20/2018   Lab Results  Component Value Date   HIV1RNAQUANT <20 DETECTED (A) 05/10/2020   No results found for: HEPBSAB Lab Results  Component Value Date   LABRPR REACTIVE (A)  05/10/2020    CBC Lab Results  Component Value Date   WBC 5.6 05/14/2020   RBC 4.96 05/14/2020   HGB 14.3 05/14/2020   HCT 44.9 05/14/2020   PLT 318 05/14/2020   MCV 90.5 05/14/2020   MCH 28.8 05/14/2020   MCHC 31.8 05/14/2020   RDW 14.9 05/14/2020   LYMPHSABS 2,427 05/10/2020   MONOABS 0.7 05/05/2018   EOSABS 90 05/10/2020    BMET Lab Results  Component Value Date   NA 142 05/14/2020   K 4.6 05/14/2020   CL 110 05/14/2020   CO2 25 05/14/2020   GLUCOSE 102 (H) 05/14/2020   BUN 6 05/14/2020   CREATININE 1.05 05/14/2020   CALCIUM 9.2 05/14/2020   GFRNONAA >60 05/14/2020   GFRAA >60 05/14/2020      Assessment and Plan  hiv disease = will check labs, expect to be well controlled.  Hx of syphilis = will check RPR  Long term emdication management =  Cr stable  Health maintenance = Recommend to get booster for moderna; to receive flu vaccine today  Pre-hypertension = will need to continue monitor BP

## 2020-09-13 LAB — URINE CYTOLOGY ANCILLARY ONLY
Chlamydia: NEGATIVE
Comment: NEGATIVE
Comment: NORMAL
Neisseria Gonorrhea: NEGATIVE

## 2020-09-13 LAB — T-HELPER CELL (CD4) - (RCID CLINIC ONLY)
CD4 % Helper T Cell: 39 % (ref 33–65)
CD4 T Cell Abs: 984 /uL (ref 400–1790)

## 2020-09-14 LAB — COMPLETE METABOLIC PANEL WITH GFR
AG Ratio: 1.6 (calc) (ref 1.0–2.5)
ALT: 14 U/L (ref 9–46)
AST: 18 U/L (ref 10–40)
Albumin: 4.6 g/dL (ref 3.6–5.1)
Alkaline phosphatase (APISO): 56 U/L (ref 36–130)
BUN: 9 mg/dL (ref 7–25)
CO2: 28 mmol/L (ref 20–32)
Calcium: 9.8 mg/dL (ref 8.6–10.3)
Chloride: 105 mmol/L (ref 98–110)
Creat: 0.98 mg/dL (ref 0.60–1.35)
GFR, Est African American: 126 mL/min/{1.73_m2} (ref >=60)
GFR, Est Non African American: 109 mL/min/{1.73_m2} (ref >=60)
Globulin: 2.9 g/dL (calc) (ref 1.9–3.7)
Glucose, Bld: 86 mg/dL (ref 65–99)
Potassium: 4.1 mmol/L (ref 3.5–5.3)
Sodium: 142 mmol/L (ref 135–146)
Total Bilirubin: 0.3 mg/dL (ref 0.2–1.2)
Total Protein: 7.5 g/dL (ref 6.1–8.1)

## 2020-09-14 LAB — CBC WITH DIFFERENTIAL/PLATELET
Absolute Monocytes: 348 cells/uL (ref 200–950)
Basophils Absolute: 29 cells/uL (ref 0–200)
Basophils Relative: 0.6 %
Eosinophils Absolute: 59 cells/uL (ref 15–500)
Eosinophils Relative: 1.2 %
HCT: 43.1 % (ref 38.5–50.0)
Hemoglobin: 15 g/dL (ref 13.2–17.1)
Lymphs Abs: 2661 cells/uL (ref 850–3900)
MCH: 31 pg (ref 27.0–33.0)
MCHC: 34.8 g/dL (ref 32.0–36.0)
MCV: 89 fL (ref 80.0–100.0)
MPV: 9.9 fL (ref 7.5–12.5)
Monocytes Relative: 7.1 %
Neutro Abs: 1803 cells/uL (ref 1500–7800)
Neutrophils Relative %: 36.8 %
Platelets: 307 10*3/uL (ref 140–400)
RBC: 4.84 10*6/uL (ref 4.20–5.80)
RDW: 13.3 % (ref 11.0–15.0)
Total Lymphocyte: 54.3 %
WBC: 4.9 10*3/uL (ref 3.8–10.8)

## 2020-09-14 LAB — HIV-1 RNA QUANT-NO REFLEX-BLD
HIV 1 RNA Quant: <20 Copies/mL
HIV-1 RNA Quant, Log: <1.3 Log cps/mL

## 2020-09-14 LAB — FLUORESCENT TREPONEMAL AB(FTA)-IGG-BLD: Fluorescent Treponemal ABS: REACTIVE — AB

## 2020-09-14 LAB — RPR TITER: RPR Titer: 1:2 {titer} — ABNORMAL HIGH

## 2020-09-14 LAB — RPR: RPR Ser Ql: REACTIVE — AB

## 2020-10-07 ENCOUNTER — Other Ambulatory Visit: Payer: Self-pay | Admitting: Internal Medicine

## 2020-10-07 DIAGNOSIS — B20 Human immunodeficiency virus [HIV] disease: Secondary | ICD-10-CM

## 2021-01-12 ENCOUNTER — Encounter (HOSPITAL_COMMUNITY): Payer: Self-pay | Admitting: Emergency Medicine

## 2021-01-12 ENCOUNTER — Emergency Department (HOSPITAL_COMMUNITY)
Admission: EM | Admit: 2021-01-12 | Discharge: 2021-01-12 | Disposition: A | Discharge status: Home / Self Care | Payer: Medicare Other | Attending: Emergency Medicine | Admitting: Emergency Medicine

## 2021-01-12 DIAGNOSIS — W4904XA Ring or other jewelry causing external constriction, initial encounter: Secondary | ICD-10-CM | POA: Diagnosis not present

## 2021-01-12 DIAGNOSIS — S6992XA Unspecified injury of left wrist, hand and finger(s), initial encounter: Secondary | ICD-10-CM | POA: Diagnosis present

## 2021-01-12 DIAGNOSIS — Z21 Asymptomatic human immunodeficiency virus [HIV] infection status: Secondary | ICD-10-CM | POA: Diagnosis not present

## 2021-01-12 DIAGNOSIS — M7989 Other specified soft tissue disorders: Secondary | ICD-10-CM | POA: Insufficient documentation

## 2021-01-12 DIAGNOSIS — S60449A External constriction of unspecified finger, initial encounter: Secondary | ICD-10-CM

## 2021-01-12 NOTE — ED Notes (Signed)
Ring cut by Dr. Oletta Cohn with trauma shears. Small scrape noted to finger, bandage applied.

## 2021-01-12 NOTE — ED Provider Notes (Signed)
[facility] EMERGENCY DEPARTMENT Provider Note   CSN: [id number] Arrival date & time: 01/12/21  0010     History No chief complaint on file.   Matthew Schroeder is a 25 y.o. male.  Patient has a ring stuck on his left middle finger.  Patient complaining of some pain and swelling, requesting the ring to be cut off.        Past Medical History:  Diagnosis Date  . HIV disease [facility]     Patient Active Problem List   Diagnosis Date Noted  . Healthcare maintenance 11/20/2018  . Mixed bipolar I disorder (HCC) 07/16/2016  . Attention deficit hyperactivity disorder (ADHD) 07/16/2016  . HIV (human immunodeficiency virus infection) (HCC) 06/08/2016    History reviewed. No pertinent surgical history.     History reviewed. No pertinent family history.  Social History   Tobacco Use  . Smoking status: Never Smoker  . Smokeless tobacco: Never Used  Substance Use Topics  . Alcohol use: No  . Drug use: No    Home Medications Prior to Admission medications   Medication Sig Start Date End Date Taking? Authorizing Provider  GENVOYA 150-150-200-10 MG TABS tablet TAKE 1 TABLET BY MOUTH DAILY WITH FOOD 10/09/20   Judyann Munson, MD  ondansetron (ZOFRAN ODT) 4 MG disintegrating tablet Take 1 tablet (4 mg total) by mouth every 8 (eight) hours as needed for nausea or vomiting. Patient not taking: Reported on 09/12/2020 05/14/20   Maxwell Caul, PA-C    Allergies    Patient has no known allergies.  Review of Systems   Review of Systems  Skin: Positive for wound.  Neurological: Negative.     Physical Exam Updated Vital Signs BP (!) 160/102 (BP Location: Right Arm)   Pulse 82   Temp (!) 97.5 F (36.4 C) (Oral)   Resp 16   SpO2 100%   Physical Exam Vitals and nursing note reviewed.  Constitutional:      Appearance: Normal appearance.  Musculoskeletal:     Comments: Ring at base of left middle finger with swelling around the ring.  No open wound,  erythema, drainage or signs of infection.  Skin:    General: Skin is warm and dry.     Findings: No erythema.  Neurological:     Mental Status: He is alert.     Sensory: Sensation is intact.     Motor: Motor function is intact.     ED Results / Procedures / Treatments   Labs (all labs ordered are listed, but only abnormal results are displayed) Labs Reviewed - No data to display  EKG None  Radiology No results found.  Procedures Procedures   Medications Ordered in ED Medications - No data to display  ED Course  I have reviewed the triage vital signs and the nursing notes.  Pertinent labs & imaging results that were available during my care of the patient were reviewed by me and considered in my medical decision making (see chart for details).    MDM Rules/Calculators/A&P                          Patient with a ring on his finger, no signs of infection or significant breakdown of the skin.  Ring was cut with trauma shears and removed without difficulty.  Final Clinical Impression(s) / ED Diagnoses Final diagnoses:  Constrictive jewelry of finger, initial encounter    Rx / DC Orders ED Discharge  Orders    None       Gilda Crease, MD 01/12/21 0210

## 2021-01-12 NOTE — ED Triage Notes (Signed)
Pt requesting his ring be cut off.

## 2021-02-27 ENCOUNTER — Other Ambulatory Visit: Payer: Self-pay

## 2021-02-27 ENCOUNTER — Encounter (HOSPITAL_COMMUNITY): Payer: Self-pay

## 2021-02-27 ENCOUNTER — Emergency Department (HOSPITAL_COMMUNITY): Payer: Medicare HMO

## 2021-02-27 ENCOUNTER — Emergency Department (HOSPITAL_COMMUNITY)
Admission: EM | Admit: 2021-02-27 | Discharge: 2021-02-27 | Disposition: A | Discharge status: Home / Self Care | Payer: Medicare HMO | Attending: Emergency Medicine | Admitting: Emergency Medicine

## 2021-02-27 DIAGNOSIS — J029 Acute pharyngitis, unspecified: Secondary | ICD-10-CM | POA: Diagnosis present

## 2021-02-27 DIAGNOSIS — B20 Human immunodeficiency virus [HIV] disease: Secondary | ICD-10-CM | POA: Diagnosis not present

## 2021-02-27 DIAGNOSIS — Z20822 Contact with and (suspected) exposure to covid-19: Secondary | ICD-10-CM | POA: Diagnosis not present

## 2021-02-27 LAB — SARS CORONAVIRUS 2 (TAT 6-24 HRS): SARS Coronavirus 2: NEGATIVE

## 2021-02-27 LAB — GROUP A STREP BY PCR: Group A Strep by PCR: NOT DETECTED

## 2021-02-27 MED ORDER — BENZONATATE 100 MG PO CAPS
100.0000 mg | ORAL_CAPSULE | Freq: Three times a day (TID) | ORAL | 0 refills | Status: DC
Start: 2021-02-27 — End: 2022-01-24

## 2021-02-27 MED ORDER — PHENASEPTIC 1.4 % MT LIQD: 1.0000 | OROMUCOSAL | 0 refills | Status: DC | PRN

## 2021-02-27 NOTE — Discharge Instructions (Addendum)
You likely have a viral illness.  This should be treated symptomatically. Use Tylenol or ibuprofen as needed for sore throat. Use Ventolin spray as needed for sore throat. Use Tessalon to help with cough.   Make sure you stay well-hydrated with water. Wash your hands frequently to prevent spread of infection. Follow-up with Dr. Drue Second in 1 week if your symptoms are not improving. Return to the emergency room if you develop chest pain, difficulty breathing, or any new or worsening symptoms.   Your Covid test is pending.  If results are positive, you should receive a phone call.  If negative, you will not.  Either way, you may check online on MyChart

## 2021-02-27 NOTE — ED Provider Notes (Signed)
MSE was initiated and I personally evaluated the patient and placed orders (if any) at  06:05 on February 27, 2021.  Patient with nasal congestion, sore throat, cough x 1 week.  Posterior pharyngeal erythema noted.  Tolerating secretions that difficulty.  No hot potato voice.  No meningismus.  Lungs without focal adventitious breath sounds.  The patient appears stable so that the remainder of the MSE may be completed by another provider.   Desmond Lope 02/27/21 0645    Dione Booze, MD 02/27/21 [phone]

## 2021-02-27 NOTE — ED Provider Notes (Signed)
[facility]-EMERGENCY DEPT Provider Note   CSN: [id number] Arrival date & time: 02/27/21  0544     History Chief Complaint  Patient presents with  . Sore Throat    Matthew Schroeder is a 25 y.o. male presenting for evaluation of sore throat.  Patient states he has had a 1 week history cough, nasal congestion, sore throat.  Initially his cough was productive, but is no longer.  He took NyQuil without improvement of symptoms, has not tried nothing else.  Has not taken any Tylenol or ibuprofen.  He denies sick contacts.  He states he is vaccinated for COVID, but has not received the booster.  He has a history of HIV for which he takes Genvoya, has not missed any doses.  He follows with Dr. Ilsa Iha, but has not gotten his viral counts or CD4 count checked recently.  He denies fevers, chest pain, nausea, vomiting or abdominal pain.  Additional history obtained from chart review.  Reviewed patient's labs with ID.  His syphilis was positive, but per notes this has already been treated in [place].  Viral loads were undetectable and CD4 count in the 900s  HPI     Past Medical History:  Diagnosis Date  . HIV disease [facility])     Patient Active Problem List   Diagnosis Date Noted  . Healthcare maintenance 11/20/2018  . Mixed bipolar I disorder (HCC) 07/16/2016  . Attention deficit hyperactivity disorder (ADHD) 07/16/2016  . HIV (human immunodeficiency virus infection) (HCC) 06/08/2016    History reviewed. No pertinent surgical history.     History reviewed. No pertinent family history.  Social History   Tobacco Use  . Smoking status: Never Smoker  . Smokeless tobacco: Never Used  Substance Use Topics  . Alcohol use: No  . Drug use: No    Home Medications Prior to Admission medications   Medication Sig Start Date End Date Taking? Authorizing Provider  benzonatate (TESSALON) 100 MG capsule Take 1 capsule (100 mg total) by mouth every 8 (eight) hours. 02/27/21   Yes Binnie Droessler, PA-C  phenol (PHENASEPTIC) 1.4 % LIQD Use as directed 1 spray in the mouth or throat as needed for throat irritation / pain. 02/27/21  Yes Hatley Henegar, PA-C  GENVOYA 150-150-200-10 MG TABS tablet TAKE 1 TABLET BY MOUTH DAILY WITH FOOD 10/09/20   Judyann Munson, MD  ondansetron (ZOFRAN ODT) 4 MG disintegrating tablet Take 1 tablet (4 mg total) by mouth every 8 (eight) hours as needed for nausea or vomiting. Patient not taking: Reported on 09/12/2020 05/14/20   Maxwell Caul, PA-C    Allergies    Patient has no known allergies.  Review of Systems   Review of Systems  Constitutional: Negative for fever.  HENT: Positive for congestion and sore throat.   Respiratory: Positive for cough.     Physical Exam Updated Vital Signs BP (!) 169/102   Pulse 88   Temp 98.4 F (36.9 C) (Oral)   Resp 16   Ht 5\' 4"  (1.626 m)   Wt 58.1 kg   SpO2 95%   BMI 21.97 kg/m   Physical Exam Vitals and nursing note reviewed.  Constitutional:      General: He is not in acute distress.    Appearance: He is well-developed.     Comments: Sitting in the bed in no acute distress  HENT:     Head: Normocephalic and atraumatic.     Comments: OP erythematous without tonsillar swelling or exudate.  Uvula midline.  No hot potato voice.  Handling secretions easily.  No trismus. Cardiovascular:     Rate and Rhythm: Normal rate and regular rhythm.     Pulses: Normal pulses.  Pulmonary:     Effort: Pulmonary effort is normal.     Breath sounds: Normal breath sounds.     Comments: Clear lung sounds in all fields Abdominal:     General: There is no distension.     Palpations: Abdomen is soft. There is no mass.     Tenderness: There is no abdominal tenderness. There is no guarding or rebound.  Musculoskeletal:        General: Normal range of motion.     Cervical back: Normal range of motion.  Skin:    General: Skin is warm.     Capillary Refill: Capillary refill takes less than 2  seconds.     Findings: No rash.  Neurological:     Mental Status: He is alert and oriented to person, place, and time.     ED Results / Procedures / Treatments   Labs (all labs ordered are listed, but only abnormal results are displayed) Labs Reviewed  GROUP A STREP BY PCR  SARS CORONAVIRUS 2 (TAT 6-24 HRS)    EKG None  Radiology DG Chest 2 View  Result Date: 02/27/2021 CLINICAL DATA:  25 year old male with cough and sore throat for 1 week. EXAM: CHEST - 2 VIEW COMPARISON:  Chest radiographs 12/19/2017 and earlier. FINDINGS: Lower lung volumes. Mediastinal contours remain normal. Visualized tracheal air column is within normal limits. Both lungs remain clear. No pneumothorax or pleural effusion. Mild scoliosis. No acute osseous abnormality identified. Negative visible bowel gas pattern. IMPRESSION: Lower lung volumes.  No cardiopulmonary abnormality. Electronically Signed   By: Odessa Fleming M.D.   On: 02/27/2021 06:22    Procedures Procedures   Medications Ordered in ED Medications - No data to display  ED Course  I have reviewed the triage vital signs and the nursing notes.  Pertinent labs & imaging results that were available during my care of the patient were reviewed by me and considered in my medical decision making (see chart for details).    MDM Rules/Calculators/A&P                          Patient presenting for evaluation of sore throat, cough, congestion.  On exam, patient appears nontoxic.  Airway intact.  No respiratory distress.  X-ray change in triage read interpreted by me, no pneumonia or thorax effusion.  Strep test negative.  Likely viral.  Discussed findings with patient.  Offered Covid test, patient is agreeable.  Discussed symptomatic treatment and follow-up with Dr. Drue Second if symptoms not proving.  At this time, patient appears safe for discharge.  Return precautions given.  Patient states he understands and agrees to plan.  Final Clinical Impression(s) / ED  Diagnoses Final diagnoses:  Pharyngitis, unspecified etiology    Rx / DC Orders ED Discharge Orders         Ordered    benzonatate (TESSALON) 100 MG capsule  Every 8 hours        02/27/21 0731    phenol (PHENASEPTIC) 1.4 % LIQD  As needed        02/27/21 0731           Alveria Apley, PA-C 02/27/21 0735    Linwood Dibbles, MD 02/28/21 [phone]

## 2021-02-27 NOTE — ED Triage Notes (Signed)
Pt reports sore throat for 1 week.

## 2021-03-07 ENCOUNTER — Ambulatory Visit: Payer: Medicare Other | Admitting: Internal Medicine

## 2021-03-12 ENCOUNTER — Other Ambulatory Visit: Payer: Self-pay

## 2021-03-12 DIAGNOSIS — B20 Human immunodeficiency virus [HIV] disease: Secondary | ICD-10-CM

## 2021-03-12 MED ORDER — GENVOYA 150-150-200-10 MG PO TABS: 1.0000 | ORAL_TABLET | Freq: Every day | ORAL | 0 refills | Status: DC

## 2021-03-20 ENCOUNTER — Other Ambulatory Visit: Payer: Self-pay

## 2021-03-20 DIAGNOSIS — Z21 Asymptomatic human immunodeficiency virus [HIV] infection status: Secondary | ICD-10-CM

## 2021-03-20 DIAGNOSIS — B20 Human immunodeficiency virus [HIV] disease: Secondary | ICD-10-CM

## 2021-03-20 DIAGNOSIS — Z113 Encounter for screening for infections with a predominantly sexual mode of transmission: Secondary | ICD-10-CM

## 2021-03-20 DIAGNOSIS — Z79899 Other long term (current) drug therapy: Secondary | ICD-10-CM

## 2021-03-21 ENCOUNTER — Other Ambulatory Visit: Payer: Medicare HMO

## 2021-03-21 ENCOUNTER — Other Ambulatory Visit: Payer: Self-pay

## 2021-03-21 DIAGNOSIS — Z79899 Other long term (current) drug therapy: Secondary | ICD-10-CM

## 2021-03-21 DIAGNOSIS — Z113 Encounter for screening for infections with a predominantly sexual mode of transmission: Secondary | ICD-10-CM

## 2021-03-21 DIAGNOSIS — B20 Human immunodeficiency virus [HIV] disease: Secondary | ICD-10-CM

## 2021-03-22 LAB — T-HELPER CELL (CD4) - (RCID CLINIC ONLY)
CD4 % Helper T Cell: 40 % (ref 33–65)
CD4 T Cell Abs: 1278 /uL (ref 400–1790)

## 2021-03-23 LAB — COMPLETE METABOLIC PANEL WITH GFR
AG Ratio: 1.6 (calc) (ref 1.0–2.5)
ALT: 15 U/L (ref 9–46)
AST: 18 U/L (ref 10–40)
Albumin: 4.6 g/dL (ref 3.6–5.1)
Alkaline phosphatase (APISO): 69 U/L (ref 36–130)
BUN: 11 mg/dL (ref 7–25)
CO2: 28 mmol/L (ref 20–32)
Calcium: 9.7 mg/dL (ref 8.6–10.3)
Chloride: 104 mmol/L (ref 98–110)
Creat: 1.13 mg/dL (ref 0.60–1.35)
GFR, Est African American: 106 mL/min/{1.73_m2} (ref >=60)
GFR, Est Non African American: 92 mL/min/{1.73_m2} (ref >=60)
Globulin: 2.8 g/dL (calc) (ref 1.9–3.7)
Glucose, Bld: 92 mg/dL (ref 65–99)
Potassium: 3.8 mmol/L (ref 3.5–5.3)
Sodium: 141 mmol/L (ref 135–146)
Total Bilirubin: 0.6 mg/dL (ref 0.2–1.2)
Total Protein: 7.4 g/dL (ref 6.1–8.1)

## 2021-03-23 LAB — LIPID PANEL
Cholesterol: 206 mg/dL — ABNORMAL HIGH (ref <200)
HDL: 47 mg/dL (ref >=40)
LDL Cholesterol (Calc): 132 mg/dL (calc) — ABNORMAL HIGH
Non-HDL Cholesterol (Calc): 159 mg/dL (calc) — ABNORMAL HIGH (ref <130)
Total CHOL/HDL Ratio: 4.4 (calc) (ref <5.0)
Triglycerides: 153 mg/dL — ABNORMAL HIGH (ref <150)

## 2021-03-23 LAB — RPR: RPR Ser Ql: REACTIVE — AB

## 2021-03-23 LAB — CBC WITH DIFFERENTIAL/PLATELET
Absolute Monocytes: 441 cells/uL (ref 200–950)
Basophils Absolute: 41 cells/uL (ref 0–200)
Basophils Relative: 0.7 %
Eosinophils Absolute: 70 cells/uL (ref 15–500)
Eosinophils Relative: 1.2 %
HCT: 43 % (ref 38.5–50.0)
Hemoglobin: 14.3 g/dL (ref 13.2–17.1)
Lymphs Abs: 3335 cells/uL (ref 850–3900)
MCH: 29.3 pg (ref 27.0–33.0)
MCHC: 33.3 g/dL (ref 32.0–36.0)
MCV: 88.1 fL (ref 80.0–100.0)
MPV: 9.5 fL (ref 7.5–12.5)
Monocytes Relative: 7.6 %
Neutro Abs: 1914 cells/uL (ref 1500–7800)
Neutrophils Relative %: 33 %
Platelets: 342 10*3/uL (ref 140–400)
RBC: 4.88 10*6/uL (ref 4.20–5.80)
RDW: 14.1 % (ref 11.0–15.0)
Total Lymphocyte: 57.5 %
WBC: 5.8 10*3/uL (ref 3.8–10.8)

## 2021-03-23 LAB — FLUORESCENT TREPONEMAL AB(FTA)-IGG-BLD: Fluorescent Treponemal ABS: REACTIVE — AB

## 2021-03-23 LAB — HIV-1 RNA QUANT-NO REFLEX-BLD
HIV 1 RNA Quant: NOT DETECTED Copies/mL
HIV-1 RNA Quant, Log: NOT DETECTED Log cps/mL

## 2021-03-23 LAB — RPR TITER: RPR Titer: 1:1 {titer} — ABNORMAL HIGH

## 2021-04-06 ENCOUNTER — Ambulatory Visit: Payer: Medicare HMO | Admitting: Internal Medicine

## 2021-05-11 ENCOUNTER — Other Ambulatory Visit: Payer: Self-pay | Admitting: Internal Medicine

## 2021-05-11 ENCOUNTER — Other Ambulatory Visit (HOSPITAL_COMMUNITY): Payer: Self-pay

## 2021-05-11 DIAGNOSIS — B20 Human immunodeficiency virus [HIV] disease: Secondary | ICD-10-CM

## 2021-05-11 MED ORDER — GENVOYA 150-150-200-10 MG PO TABS
1.0000 | ORAL_TABLET | Freq: Every day | ORAL | 0 refills | Status: DC
  Filled 2021-05-11: qty 30, 30d supply, fill #0

## 2021-05-14 ENCOUNTER — Other Ambulatory Visit (HOSPITAL_COMMUNITY): Payer: Self-pay

## 2021-05-15 ENCOUNTER — Other Ambulatory Visit (HOSPITAL_COMMUNITY): Payer: Self-pay

## 2021-06-08 ENCOUNTER — Other Ambulatory Visit (HOSPITAL_COMMUNITY): Payer: Self-pay

## 2021-06-08 ENCOUNTER — Other Ambulatory Visit: Payer: Self-pay | Admitting: Internal Medicine

## 2021-06-08 ENCOUNTER — Telehealth: Payer: Self-pay

## 2021-06-08 DIAGNOSIS — B20 Human immunodeficiency virus [HIV] disease: Secondary | ICD-10-CM

## 2021-06-08 MED ORDER — GENVOYA 150-150-200-10 MG PO TABS
1.0000 | ORAL_TABLET | Freq: Every day | ORAL | 2 refills | Status: DC
  Filled 2021-06-08 – 2021-06-12 (×2): qty 30, 30d supply, fill #0
  Filled 2021-07-13: qty 30, 30d supply, fill #1

## 2021-06-08 NOTE — Telephone Encounter (Signed)
Reached out to patient to schedule overdue appointment after receiving refill request for Genvoya. RN attempted to reach patient last month to schedule appt as well.  Left vm requesting call back.  Juanita Laster, RMA

## 2021-06-11 ENCOUNTER — Other Ambulatory Visit (HOSPITAL_COMMUNITY): Payer: Self-pay

## 2021-06-12 ENCOUNTER — Other Ambulatory Visit (HOSPITAL_COMMUNITY): Payer: Self-pay

## 2021-06-14 ENCOUNTER — Other Ambulatory Visit: Payer: Medicare HMO

## 2021-06-14 ENCOUNTER — Other Ambulatory Visit: Payer: Self-pay

## 2021-06-14 DIAGNOSIS — B20 Human immunodeficiency virus [HIV] disease: Secondary | ICD-10-CM

## 2021-07-04 ENCOUNTER — Telehealth: Payer: Self-pay

## 2021-07-04 ENCOUNTER — Encounter: Payer: Medicare HMO | Admitting: Family

## 2021-07-04 NOTE — Telephone Encounter (Signed)
Left VM asking patient to call our office back to reschedule missed appointment today.    Heylee Tant P Taye Cato, CMA  

## 2021-07-13 ENCOUNTER — Other Ambulatory Visit (HOSPITAL_COMMUNITY): Payer: Self-pay

## 2021-08-07 ENCOUNTER — Other Ambulatory Visit (HOSPITAL_COMMUNITY): Payer: Self-pay

## 2021-08-22 ENCOUNTER — Other Ambulatory Visit (HOSPITAL_COMMUNITY): Payer: Self-pay

## 2021-10-04 ENCOUNTER — Encounter (HOSPITAL_COMMUNITY): Payer: Self-pay | Admitting: *Deleted

## 2021-10-04 ENCOUNTER — Other Ambulatory Visit (HOSPITAL_COMMUNITY): Payer: Self-pay

## 2021-10-04 ENCOUNTER — Other Ambulatory Visit: Payer: Self-pay

## 2021-10-04 ENCOUNTER — Ambulatory Visit (HOSPITAL_COMMUNITY)
Admission: EM | Admit: 2021-10-04 | Discharge: 2021-10-04 | Disposition: A | Discharge status: Home / Self Care | Payer: Medicare HMO | Attending: Physician Assistant | Admitting: Physician Assistant

## 2021-10-04 DIAGNOSIS — K2900 Acute gastritis without bleeding: Secondary | ICD-10-CM | POA: Diagnosis not present

## 2021-10-04 DIAGNOSIS — R1012 Left upper quadrant pain: Secondary | ICD-10-CM

## 2021-10-04 DIAGNOSIS — R194 Change in bowel habit: Secondary | ICD-10-CM

## 2021-10-04 MED ORDER — ALUM & MAG HYDROXIDE-SIMETH 200-200-20 MG/5ML PO SUSP
30.0000 mL | Freq: Once | ORAL | Status: AC
  Administered 2021-10-04: 30 mL via ORAL

## 2021-10-04 MED ORDER — ALUM & MAG HYDROXIDE-SIMETH 200-200-20 MG/5ML PO SUSP
ORAL | Status: AC
  Filled 2021-10-04: qty 30

## 2021-10-04 MED ORDER — LIDOCAINE VISCOUS HCL 2 % MT SOLN
15.0000 mL | Freq: Once | OROMUCOSAL | Status: AC
  Administered 2021-10-04: 15 mL via ORAL

## 2021-10-04 MED ORDER — LIDOCAINE VISCOUS HCL 2 % MT SOLN
OROMUCOSAL | Status: AC
  Filled 2021-10-04: qty 15

## 2021-10-04 MED ORDER — PANTOPRAZOLE SODIUM 20 MG PO TBEC
20.0000 mg | DELAYED_RELEASE_TABLET | Freq: Every day | ORAL | 0 refills | Status: DC
  Filled 2021-10-04: qty 14, 14d supply, fill #0

## 2021-10-04 MED ORDER — FAMOTIDINE 40 MG PO TABS
40.0000 mg | ORAL_TABLET | Freq: Every day | ORAL | 0 refills | Status: DC
  Filled 2021-10-04: qty 14, 14d supply, fill #0

## 2021-10-04 NOTE — ED Triage Notes (Signed)
Pt reports ABD pain started today. Pt reports he has seen  GI for ABD pain but forgot what was said.

## 2021-10-04 NOTE — ED Provider Notes (Signed)
[facility]    CSN: [id number] Arrival date & time: 10/04/21  1056      History   Chief Complaint Chief Complaint  Patient presents with   Abdominal Pain    HPI Matthew Schroeder is a 25 y.o. male.   Patient presents today with a several hour history of left upper quadrant abdominal pain.  He does have a history of HIV but reports that he is taking medication as prescribed with viral load undetectable in April 2022.  He denies any medication changes or recent antibiotic use.  He denies any nausea or vomiting but does report some loose stool earlier today; denies any melena or hematochezia.  He has not tried any over-the-counter medication for symptom management.  Reports eating at a new restaurant yesterday and having friends who have had GI symptoms after their meal.  He does have a history of gastrointestinal disorder was worked up by GI in [place] but does not remember what his formal diagnosis was.  Reports symptoms had resolved and he is not taking any medication; denies any inflammatory bowel history.  He reports current pain is rated 4 on a 0-10 pain scale, localized to left upper quadrant, described as aching, no aggravating relieving factors identified.   Past Medical History:  Diagnosis Date   HIV disease [facility])     Patient Active Problem List   Diagnosis Date Noted   Healthcare maintenance 11/20/2018   Mixed bipolar I disorder (HCC) 07/16/2016   Attention deficit hyperactivity disorder (ADHD) 07/16/2016   HIV (human immunodeficiency virus infection) (HCC) 06/08/2016    History reviewed. No pertinent surgical history.     Home Medications    Prior to Admission medications   Medication Sig Start Date End Date Taking? Authorizing Provider  famotidine (PEPCID) 40 MG tablet Take 1 tablet (40 mg total) by mouth at bedtime. 10/04/21  Yes Travers Goodley K, PA-C  pantoprazole (PROTONIX) 20 MG tablet Take 1 tablet (20 mg total) by mouth daily. 10/04/21  Yes  Dalana Pfahler K, PA-C  benzonatate (TESSALON) 100 MG capsule Take 1 capsule (100 mg total) by mouth every 8 (eight) hours. 02/27/21   Caccavale, Sophia, PA-C  elvitegravir-cobicistat-emtricitabine-tenofovir (GENVOYA) 150-150-200-10 MG TABS tablet Take 1 tablet by mouth daily. with food 06/08/21   Judyann Munson, MD  ondansetron (ZOFRAN ODT) 4 MG disintegrating tablet Take 1 tablet (4 mg total) by mouth every 8 (eight) hours as needed for nausea or vomiting. Patient not taking: Reported on 09/12/2020 05/14/20   Graciella Freer A, PA-C  phenol (PHENASEPTIC) 1.4 % LIQD Use as directed 1 spray in the mouth or throat as needed for throat irritation / pain. 02/27/21   Caccavale, Sophia, PA-C    Family History History reviewed. No pertinent family history.  Social History Social History   Tobacco Use   Smoking status: Never   Smokeless tobacco: Never  Substance Use Topics   Alcohol use: No   Drug use: No     Allergies   Patient has no known allergies.   Review of Systems Review of Systems  Constitutional:  Positive for activity change. Negative for appetite change, fatigue and fever.  Respiratory:  Negative for cough and shortness of breath.   Cardiovascular:  Negative for chest pain.  Gastrointestinal:  Positive for abdominal pain and diarrhea. Negative for constipation, nausea and vomiting.  Genitourinary:  Negative for dysuria, frequency and urgency.  Musculoskeletal:  Negative for arthralgias and myalgias.  Neurological:  Negative for dizziness, light-headedness and headaches.  Physical Exam Triage Vital Signs ED Triage Vitals  Enc Vitals Group     BP 10/04/21 1145 138/87     Pulse Rate 10/04/21 1145 71     Resp 10/04/21 1145 18     Temp 10/04/21 1145 98.8 F (37.1 C)     Temp src --      SpO2 10/04/21 1145 100 %     Weight --      Height --      Head Circumference --      Peak Flow --      Pain Score 10/04/21 1148 4     Pain Loc --      Pain Edu? --      Excl. in GC?  --    No data found.  Updated Vital Signs BP 138/87   Pulse 71   Temp 98.8 F (37.1 C)   Resp 18   SpO2 100%   Visual Acuity Right Eye Distance:   Left Eye Distance:   Bilateral Distance:    Right Eye Near:   Left Eye Near:    Bilateral Near:     Physical Exam Vitals reviewed.  Constitutional:      General: He is awake.     Appearance: Normal appearance. He is well-developed. He is not ill-appearing.     Comments: Very pleasant male appears stated age no acute distress  HENT:     Head: Normocephalic and atraumatic.     Mouth/Throat:     Mouth: Mucous membranes are moist.     Pharynx: Uvula midline. No oropharyngeal exudate or posterior oropharyngeal erythema.  Cardiovascular:     Rate and Rhythm: Normal rate and regular rhythm.     Heart sounds: Normal heart sounds, S1 normal and S2 normal. No murmur heard. Pulmonary:     Effort: Pulmonary effort is normal.     Breath sounds: Normal breath sounds. No stridor. No wheezing, rhonchi or rales.     Comments: Clear to auscultation bilaterally Abdominal:     General: Bowel sounds are normal.     Palpations: Abdomen is soft.     Tenderness: There is abdominal tenderness in the left upper quadrant. There is no right CVA tenderness, left CVA tenderness, guarding or rebound.     Comments: Mild tenderness palpation in left upper quadrant.  No evidence of acute abdomen on physical exam.  Neurological:     Mental Status: He is alert.  Psychiatric:        Behavior: Behavior is cooperative.     UC Treatments / Results  Labs (all labs ordered are listed, but only abnormal results are displayed) Labs Reviewed - No data to display  EKG   Radiology No results found.  Procedures Procedures (including critical care time)  Medications Ordered in UC Medications  alum & mag hydroxide-simeth (MAALOX/MYLANTA) 200-200-20 MG/5ML suspension 30 mL (30 mLs Oral Given 10/04/21 1239)    And  lidocaine (XYLOCAINE) 2 % viscous mouth  solution 15 mL (15 mLs Oral Given 10/04/21 1239)    Initial Impression / Assessment and Plan / UC Course  I have reviewed the triage vital signs and the nursing notes.  Pertinent labs & imaging results that were available during my care of the patient were reviewed by me and considered in my medical decision making (see chart for details).     Vital signs and physical exam reassuring today; no indication for emergent evaluation or imaging.  Patient was given GI cocktail with significant improvement  of symptoms.  Suspect gastritis as etiology of symptoms.  Initially was considering Carafate, however, given this can potentially interact absorption of antiviral medication we will defer this for the time being and try reducing medications.  Patient was started on Protonix and Pepcid.  Recommended to eat a bland diet and avoid spicy/acidic foods.  Discussed that if symptoms persist he is to follow-up with GI specialist and was given contact information for local provider.  Discussed that if symptoms worsen and he has severe abdominal pain, nausea, vomiting, blood in his stool he is to go to the ER for further evaluation and management.  Strict return precautions given to which he expressed understanding.  Final Clinical Impressions(s) / UC Diagnoses   Final diagnoses:  LUQ abdominal pain  Change in bowel habit  Acute gastritis without hemorrhage, unspecified gastritis type     Discharge Instructions      Believe that you have irritation of your stomach following causing your symptoms.  Please take Protonix daily on an empty stomach (30 minutes before meal or 2 hours after) and Pepcid 40 mg at night.  Avoid spicy/acidic/fatty foods.  Make sure you are drinking plenty of fluid.  If you have recurrent or worsening symptoms including severe abdominal pain, blood in your stool, nausea/vomiting you need to go to the emergency room.     ED Prescriptions     Medication Sig Dispense Auth. Provider    pantoprazole (PROTONIX) 20 MG tablet Take 1 tablet (20 mg total) by mouth daily. 14 tablet Jaquese Irving K, PA-C   famotidine (PEPCID) 40 MG tablet Take 1 tablet (40 mg total) by mouth at bedtime. 14 tablet Arley Garant, Noberto Retort, PA-C      PDMP not reviewed this encounter.   Jeani Hawking, PA-C 10/04/21 1308

## 2021-10-04 NOTE — Discharge Instructions (Signed)
Believe that you have irritation of your stomach following causing your symptoms.  Please take Protonix daily on an empty stomach (30 minutes before meal or 2 hours after) and Pepcid 40 mg at night.  Avoid spicy/acidic/fatty foods.  Make sure you are drinking plenty of fluid.  If you have recurrent or worsening symptoms including severe abdominal pain, blood in your stool, nausea/vomiting you need to go to the emergency room.

## 2021-10-12 ENCOUNTER — Other Ambulatory Visit (HOSPITAL_COMMUNITY): Payer: Self-pay

## 2021-10-31 ENCOUNTER — Encounter (HOSPITAL_COMMUNITY): Payer: Self-pay

## 2021-10-31 ENCOUNTER — Other Ambulatory Visit: Payer: Self-pay

## 2021-10-31 ENCOUNTER — Ambulatory Visit (HOSPITAL_COMMUNITY)
Admission: EM | Admit: 2021-10-31 | Discharge: 2021-10-31 | Disposition: A | Discharge status: Home / Self Care | Payer: Medicare HMO | Attending: Urgent Care | Admitting: Urgent Care

## 2021-10-31 DIAGNOSIS — K120 Recurrent oral aphthae: Secondary | ICD-10-CM

## 2021-10-31 MED ORDER — LIDOCAINE VISCOUS HCL 2 % MT SOLN: 5.0000 mL | Freq: Three times a day (TID) | OROMUCOSAL | 0 refills | Status: AC | PRN

## 2021-10-31 MED ORDER — VALACYCLOVIR HCL 1 G PO TABS: 1000.0000 mg | ORAL_TABLET | Freq: Two times a day (BID) | ORAL | 0 refills | Status: AC

## 2021-10-31 NOTE — ED Provider Notes (Signed)
[facility]    CSN: [id number] Arrival date & time: 10/31/21  1400      History   Chief Complaint Chief Complaint  Patient presents with   Cough   Sore Throat    HPI Matthew Schroeder is a 25 y.o. male who presents with a 1 day history of sore throat.  He states that over the past week he has had a dry cough and was concerned as one of his coworkers was sick.  He states however that over the past 24 hours he has developed a sore throat.  His cough has improved.  He denies fever, lymphadenopathy, abdominal symptoms, rash.  He does have a known history of HIV for which he takes [place].  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  Patient denies any additional upper respiratory symptoms.   Cough Associated symptoms: sore throat   Associated symptoms: no ear pain, no fever, no myalgias and no wheezing   Sore Throat   Past Medical History:  Diagnosis Date   HIV disease (HCC)     Patient Active Problem List   Diagnosis Date Noted   Healthcare maintenance 11/20/2018   Mixed bipolar I disorder (HCC) 07/16/2016   Attention deficit hyperactivity disorder (ADHD) 07/16/2016   HIV (human immunodeficiency virus infection) (HCC) 06/08/2016    History reviewed. No pertinent surgical history.     Home Medications    Prior to Admission medications   Medication Sig Start Date End Date Taking? Authorizing Provider  magic mouthwash (lidocaine, diphenhydrAMINE, alum & mag hydroxide) suspension Swish and spit 5 mLs 3 (three) times daily as needed for up to 7 days for mouth pain (best used before meals). 10/31/21 11/07/21 Yes Gracia Saggese L, PA  valACYclovir (VALTREX) 1000 MG tablet Take 1 tablet (1,000 mg total) by mouth 2 (two) times daily for 7 days. 10/31/21 11/07/21 Yes Zakye Baby L, PA  benzonatate (TESSALON) 100 MG capsule Take 1 capsule (100 mg total) by mouth every 8 (eight) hours. 02/27/21   Caccavale, Sophia, PA-C   elvitegravir-cobicistat-emtricitabine-tenofovir (GENVOYA) 150-150-200-10 MG TABS tablet Take 1 tablet by mouth daily. with food 06/08/21   Judyann Munson, MD  famotidine (PEPCID) 40 MG tablet Take 1 tablet (40 mg total) by mouth at bedtime. 10/04/21   Raspet, Erin K, PA-C  ondansetron (ZOFRAN ODT) 4 MG disintegrating tablet Take 1 tablet (4 mg total) by mouth every 8 (eight) hours as needed for nausea or vomiting. Patient not taking: Reported on 09/12/2020 05/14/20   Graciella Freer A, PA-C  pantoprazole (PROTONIX) 20 MG tablet Take 1 tablet (20 mg total) by mouth daily. 10/04/21   Raspet, Erin K, PA-C  phenol (PHENASEPTIC) 1.4 % LIQD Use as directed 1 spray in the mouth or throat as needed for throat irritation / pain. 02/27/21   Caccavale, Sophia, PA-C    Family History History reviewed. No pertinent family history.  Social History Social History   Tobacco Use   Smoking status: Never   Smokeless tobacco: Never  Substance Use Topics   Alcohol use: No   Drug use: No     Allergies   Patient has no known allergies.   Review of Systems Review of Systems  Constitutional:  Negative for fatigue and fever.  HENT:  Positive for sore throat. Negative for congestion, ear pain and sinus pressure.   Eyes:  Negative for pain.  Respiratory:  Positive for cough. Negative for wheezing.   Musculoskeletal:  Negative  for myalgias.  All other systems reviewed and are negative.   Physical Exam Triage Vital Signs ED Triage Vitals  Enc Vitals Group     BP 10/31/21 1500 (!) 141/90     Pulse Rate 10/31/21 1500 79     Resp 10/31/21 1500 18     Temp 10/31/21 1500 99.1 F (37.3 C)     Temp Source 10/31/21 1500 Oral     SpO2 10/31/21 1500 100 %     Weight --      Height --      Head Circumference --      Peak Flow --      Pain Score 10/31/21 1458 3     Pain Loc --      Pain Edu? --      Excl. in GC? --    No data found.  Updated Vital Signs BP (!) 141/90 (BP Location: Left Arm)   Pulse  79   Temp 99.1 F (37.3 C) (Oral)   Resp 18   SpO2 100%   Visual Acuity Right Eye Distance:   Left Eye Distance:   Bilateral Distance:    Right Eye Near:   Left Eye Near:    Bilateral Near:     Physical Exam Vitals and nursing note reviewed.  Constitutional:      Appearance: He is well-developed. He is obese.  HENT:     Head: Normocephalic and atraumatic.     Right Ear: Tympanic membrane and ear canal normal.     Left Ear: Tympanic membrane and ear canal normal.     Nose: No congestion or rhinorrhea.     Mouth/Throat:     Mouth: Mucous membranes are moist. Oral lesions (ulcers noted to soft palate midline, just lateral to uvula bilaterally) present.     Pharynx: Uvula midline. No pharyngeal swelling, oropharyngeal exudate, posterior oropharyngeal erythema or uvula swelling.     Tonsils: No tonsillar exudate or tonsillar abscesses.  Eyes:     Conjunctiva/sclera: Conjunctivae normal.     Pupils: Pupils are equal, round, and reactive to light.  Cardiovascular:     Rate and Rhythm: Normal rate and regular rhythm.     Heart sounds: No murmur heard.   No friction rub.  Pulmonary:     Effort: Pulmonary effort is normal.     Breath sounds: Normal breath sounds. No wheezing or rhonchi.  Chest:     Chest wall: No tenderness.  Musculoskeletal:     Cervical back: Normal range of motion and neck supple.  Lymphadenopathy:     Cervical: No cervical adenopathy.  Skin:    General: Skin is warm.  Neurological:     Mental Status: He is alert.     UC Treatments / Results  Labs (all labs ordered are listed, but only abnormal results are displayed) Labs Reviewed - No data to display  EKG   Radiology No results found.  Procedures Procedures (including critical care time)  Medications Ordered in UC Medications - No data to display  Initial Impression / Assessment and Plan / UC Course  I have reviewed the triage vital signs and the nursing notes.  Pertinent labs &  imaging results that were available during my care of the patient were reviewed by me and considered in my medical decision making (see chart for details).     Ulceration of mouth - trial of valtrex and magic mouthwash. Pt defers need for triamcinolone dental paste due to a strong gag reflex  Elevated BP without hx of HTN - last visit BP was normal. Pt to f/u with PCP for monitoring  Final Clinical Impressions(s) / UC Diagnoses   Final diagnoses:  Aphthous ulcer of mouth     Discharge Instructions      Take the prescription pills as prescribed. Use the mouthwash only as needed, keeping in mind it will numb your entire mouth Monitor for any change in symptoms and return to our office or follow up with PCP for any worsening symptoms    ED Prescriptions     Medication Sig Dispense Auth. Provider   valACYclovir (VALTREX) 1000 MG tablet Take 1 tablet (1,000 mg total) by mouth 2 (two) times daily for 7 days. 14 tablet Asheley Hellberg L, PA   magic mouthwash (lidocaine, diphenhydrAMINE, alum & mag hydroxide) suspension Swish and spit 5 mLs 3 (three) times daily as needed for up to 7 days for mouth pain (best used before meals). 105 mL Brandice Busser L, PA      PDMP not reviewed this encounter.   Maretta Bees, [place] 10/31/21 1528

## 2021-10-31 NOTE — ED Triage Notes (Signed)
Pt reports cough and sore throat x 3 days. 

## 2021-10-31 NOTE — Discharge Instructions (Signed)
Take the prescription pills as prescribed. Use the mouthwash only as needed, keeping in mind it will numb your entire mouth Monitor for any change in symptoms and return to our office or follow up with PCP for any worsening symptoms

## 2021-11-09 ENCOUNTER — Other Ambulatory Visit: Payer: Self-pay

## 2021-11-09 DIAGNOSIS — B20 Human immunodeficiency virus [HIV] disease: Secondary | ICD-10-CM

## 2021-11-09 DIAGNOSIS — Z113 Encounter for screening for infections with a predominantly sexual mode of transmission: Secondary | ICD-10-CM

## 2021-11-13 ENCOUNTER — Other Ambulatory Visit: Payer: Self-pay

## 2021-11-13 ENCOUNTER — Other Ambulatory Visit: Payer: Medicaid Other

## 2021-11-13 DIAGNOSIS — B20 Human immunodeficiency virus [HIV] disease: Secondary | ICD-10-CM

## 2021-11-13 DIAGNOSIS — Z113 Encounter for screening for infections with a predominantly sexual mode of transmission: Secondary | ICD-10-CM

## 2021-11-14 ENCOUNTER — Encounter: Payer: Self-pay | Admitting: Internal Medicine

## 2021-11-14 LAB — T-HELPER CELL (CD4) - (RCID CLINIC ONLY)
CD4 % Helper T Cell: 41 % (ref 33–65)
CD4 T Cell Abs: 724 /uL (ref 400–1790)

## 2021-11-16 LAB — RPR TITER: RPR Titer: 1:1 {titer} — ABNORMAL HIGH

## 2021-11-16 LAB — CBC WITH DIFFERENTIAL/PLATELET
Absolute Monocytes: 286 cells/uL (ref 200–950)
Basophils Absolute: 31 cells/uL (ref 0–200)
Basophils Relative: 0.6 %
Eosinophils Absolute: 82 cells/uL (ref 15–500)
Eosinophils Relative: 1.6 %
HCT: 44.9 % (ref 38.5–50.0)
Hemoglobin: 15 g/dL (ref 13.2–17.1)
Lymphs Abs: 1805 cells/uL (ref 850–3900)
MCH: 29.2 pg (ref 27.0–33.0)
MCHC: 33.4 g/dL (ref 32.0–36.0)
MCV: 87.4 fL (ref 80.0–100.0)
MPV: 9.7 fL (ref 7.5–12.5)
Monocytes Relative: 5.6 %
Neutro Abs: 2897 cells/uL (ref 1500–7800)
Neutrophils Relative %: 56.8 %
Platelets: 367 10*3/uL (ref 140–400)
RBC: 5.14 10*6/uL (ref 4.20–5.80)
RDW: 13.7 % (ref 11.0–15.0)
Total Lymphocyte: 35.4 %
WBC: 5.1 10*3/uL (ref 3.8–10.8)

## 2021-11-16 LAB — COMPLETE METABOLIC PANEL WITH GFR
AG Ratio: 1.5 (calc) (ref 1.0–2.5)
ALT: 12 U/L (ref 9–46)
AST: 15 U/L (ref 10–40)
Albumin: 4.4 g/dL (ref 3.6–5.1)
Alkaline phosphatase (APISO): 54 U/L (ref 36–130)
BUN: 9 mg/dL (ref 7–25)
CO2: 27 mmol/L (ref 20–32)
Calcium: 9.3 mg/dL (ref 8.6–10.3)
Chloride: 107 mmol/L (ref 98–110)
Creat: 1 mg/dL (ref 0.60–1.24)
Globulin: 2.9 g/dL (calc) (ref 1.9–3.7)
Glucose, Bld: 93 mg/dL (ref 65–99)
Potassium: 4.1 mmol/L (ref 3.5–5.3)
Sodium: 143 mmol/L (ref 135–146)
Total Bilirubin: 0.3 mg/dL (ref 0.2–1.2)
Total Protein: 7.3 g/dL (ref 6.1–8.1)
eGFR: 108 mL/min/{1.73_m2} (ref >=60)

## 2021-11-16 LAB — HIV-1 RNA QUANT-NO REFLEX-BLD
HIV 1 RNA Quant: 3930 Copies/mL — ABNORMAL HIGH
HIV-1 RNA Quant, Log: 3.59 Log cps/mL — ABNORMAL HIGH

## 2021-11-16 LAB — FLUORESCENT TREPONEMAL AB(FTA)-IGG-BLD: Fluorescent Treponemal ABS: REACTIVE — AB

## 2021-11-16 LAB — RPR: RPR Ser Ql: REACTIVE — AB

## 2021-11-18 ENCOUNTER — Emergency Department (HOSPITAL_COMMUNITY)
Admission: EM | Admit: 2021-11-18 | Discharge: 2021-11-18 | Disposition: A | Discharge status: Home / Self Care | Payer: Medicare HMO | Attending: Emergency Medicine | Admitting: Emergency Medicine

## 2021-11-18 ENCOUNTER — Encounter (HOSPITAL_COMMUNITY): Payer: Self-pay

## 2021-11-18 DIAGNOSIS — Z20822 Contact with and (suspected) exposure to covid-19: Secondary | ICD-10-CM | POA: Diagnosis not present

## 2021-11-18 DIAGNOSIS — R197 Diarrhea, unspecified: Secondary | ICD-10-CM | POA: Diagnosis not present

## 2021-11-18 DIAGNOSIS — Z79899 Other long term (current) drug therapy: Secondary | ICD-10-CM | POA: Insufficient documentation

## 2021-11-18 DIAGNOSIS — R112 Nausea with vomiting, unspecified: Secondary | ICD-10-CM

## 2021-11-18 LAB — COMPREHENSIVE METABOLIC PANEL
ALT: 18 U/L (ref 0–44)
AST: 31 U/L (ref 15–41)
Albumin: 4.6 g/dL (ref 3.5–5.0)
Alkaline Phosphatase: 66 U/L (ref 38–126)
Anion gap: 11 (ref 5–15)
BUN: 14 mg/dL (ref 6–20)
CO2: 22 mmol/L (ref 22–32)
Calcium: 9.3 mg/dL (ref 8.9–10.3)
Chloride: 103 mmol/L (ref 98–111)
Creatinine, Ser: 1.15 mg/dL (ref 0.61–1.24)
GFR, Estimated: >60 mL/min (ref >60)
Glucose, Bld: 103 mg/dL — ABNORMAL HIGH (ref 70–99)
Potassium: 3.5 mmol/L (ref 3.5–5.1)
Sodium: 136 mmol/L (ref 135–145)
Total Bilirubin: 0.6 mg/dL (ref 0.3–1.2)
Total Protein: 9 g/dL — ABNORMAL HIGH (ref 6.5–8.1)

## 2021-11-18 LAB — CBC
HCT: 48.6 % (ref 39.0–52.0)
Hemoglobin: 16.2 g/dL (ref 13.0–17.0)
MCH: 28.6 pg (ref 26.0–34.0)
MCHC: 33.3 g/dL (ref 30.0–36.0)
MCV: 85.9 fL (ref 80.0–100.0)
Platelets: 361 10*3/uL (ref 150–400)
RBC: 5.66 MIL/uL (ref 4.22–5.81)
RDW: 14 % (ref 11.5–15.5)
WBC: 7.3 10*3/uL (ref 4.0–10.5)
nRBC: 0 % (ref 0.0–0.2)

## 2021-11-18 LAB — RESP PANEL BY RT-PCR (FLU A&B, COVID) ARPGX2
Influenza A by PCR: NEGATIVE
Influenza B by PCR: NEGATIVE
SARS Coronavirus 2 by RT PCR: NEGATIVE

## 2021-11-18 LAB — URINALYSIS, ROUTINE W REFLEX MICROSCOPIC
Glucose, UA: NEGATIVE mg/dL
Ketones, ur: NEGATIVE mg/dL
Leukocytes,Ua: NEGATIVE
Nitrite: NEGATIVE
Protein, ur: 30 mg/dL — AB
Specific Gravity, Urine: >=1.03 (ref 1.005–1.030)
pH: 6 (ref 5.0–8.0)

## 2021-11-18 LAB — LIPASE, BLOOD: Lipase: 30 U/L (ref 11–51)

## 2021-11-18 MED ORDER — ONDANSETRON 4 MG PO TBDP: 4.0000 mg | ORAL_TABLET | Freq: Three times a day (TID) | ORAL | 0 refills | Status: DC | PRN

## 2021-11-18 MED ORDER — SODIUM CHLORIDE 0.9 % IV BOLUS
1000.0000 mL | Freq: Once | INTRAVENOUS | Status: AC
  Administered 2021-11-18: 07:00:00 1000 mL via INTRAVENOUS

## 2021-11-18 MED ORDER — ONDANSETRON HCL 4 MG/2ML IJ SOLN
4.0000 mg | Freq: Once | INTRAMUSCULAR | Status: AC
  Administered 2021-11-18: 07:00:00 4 mg via INTRAVENOUS
  Filled 2021-11-18: qty 2

## 2021-11-18 NOTE — ED Provider Notes (Signed)
[facility]-EMERGENCY DEPT Provider Note   CSN: [id number] Arrival date & time: 11/18/21  0201     History Chief Complaint  Patient presents with   Emesis   Diarrhea    TAJAY MUZZY is a 25 y.o. male with a history of HIV and bipolar disorder who presents to the emergency department with complaints of nausea, vomiting, and diarrhea for the past few days.  Patient reports approximately 2 episodes of emesis per day and 5-10 episodes of diarrhea.  Describes diarrhea as fairly watery.  No alleviating or aggravating factors to his symptoms.  He has had some mild nasal congestion.  He denies fever, chills, melena, hematochezia, dysuria, or rashes.  Denies recent foreign travel or antibiotics.  HPI     Past Medical History:  Diagnosis Date   HIV disease (HCC)     Patient Active Problem List   Diagnosis Date Noted   Healthcare maintenance 11/20/2018   Mixed bipolar I disorder (HCC) 07/16/2016   Attention deficit hyperactivity disorder (ADHD) 07/16/2016   HIV (human immunodeficiency virus infection) (HCC) 06/08/2016    History reviewed. No pertinent surgical history.     History reviewed. No pertinent family history.  Social History   Tobacco Use   Smoking status: Never   Smokeless tobacco: Never  Substance Use Topics   Alcohol use: No   Drug use: No    Home Medications Prior to Admission medications   Medication Sig Start Date End Date Taking? Authorizing Provider  benzonatate (TESSALON) 100 MG capsule Take 1 capsule (100 mg total) by mouth every 8 (eight) hours. 02/27/21   Caccavale, Sophia, PA-C  elvitegravir-cobicistat-emtricitabine-tenofovir (GENVOYA) 150-150-200-10 MG TABS tablet Take 1 tablet by mouth daily. with food 06/08/21   Judyann Munson, MD  famotidine (PEPCID) 40 MG tablet Take 1 tablet (40 mg total) by mouth at bedtime. 10/04/21   Raspet, Erin K, PA-C  ondansetron (ZOFRAN ODT) 4 MG disintegrating tablet Take 1 tablet (4 mg total)  by mouth every 8 (eight) hours as needed for nausea or vomiting. Patient not taking: Reported on 09/12/2020 05/14/20   Graciella Freer A, PA-C  pantoprazole (PROTONIX) 20 MG tablet Take 1 tablet (20 mg total) by mouth daily. 10/04/21   Raspet, Erin K, PA-C  phenol (PHENASEPTIC) 1.4 % LIQD Use as directed 1 spray in the mouth or throat as needed for throat irritation / pain. 02/27/21   Caccavale, Sophia, PA-C    Allergies    Patient has no known allergies.  Review of Systems   Review of Systems  Constitutional:  Negative for chills and fever.  Respiratory:  Negative for shortness of breath.   Cardiovascular:  Negative for chest pain.  Gastrointestinal:  Positive for diarrhea, nausea and vomiting. Negative for abdominal pain and blood in stool.  Genitourinary:  Negative for dysuria.  Skin:  Negative for rash.  Neurological:  Negative for syncope.  All other systems reviewed and are negative.  Physical Exam Updated Vital Signs BP (!) 157/110    Pulse 77    Temp 98 F (36.7 C) (Oral)    Resp 12    Ht 5\' 4"  (1.626 m)    Wt 96.6 kg    SpO2 99%    BMI 36.56 kg/m   Physical Exam Vitals and nursing note reviewed.  Constitutional:      General: He is not in acute distress.    Appearance: He is well-developed. He is not toxic-appearing.  HENT:     Head: Normocephalic and  atraumatic.     Mouth/Throat:     Mouth: Mucous membranes are dry.  Eyes:     General:        Right eye: No discharge.        Left eye: No discharge.     Conjunctiva/sclera: Conjunctivae normal.  Cardiovascular:     Rate and Rhythm: Normal rate and regular rhythm.  Pulmonary:     Effort: Pulmonary effort is normal. No respiratory distress.     Breath sounds: Normal breath sounds. No wheezing, rhonchi or rales.  Abdominal:     General: There is no distension.     Palpations: Abdomen is soft.     Tenderness: There is no abdominal tenderness. There is no guarding or rebound.  Musculoskeletal:     Cervical back: Neck  supple.  Skin:    General: Skin is warm and dry.     Findings: No rash.  Neurological:     Mental Status: He is alert.     Comments: Clear speech.   Psychiatric:        Behavior: Behavior normal.    ED Results / Procedures / Treatments   Labs (all labs ordered are listed, but only abnormal results are displayed) Labs Reviewed  COMPREHENSIVE METABOLIC PANEL - Abnormal; Notable for the following components:      Result Value   Glucose, Bld 103 (*)    Total Protein 9.0 (*)    All other components within normal limits  URINALYSIS, ROUTINE W REFLEX MICROSCOPIC - Abnormal; Notable for the following components:   Color, Urine YELLOW (*)    APPearance CLEAR (*)    Hgb urine dipstick SMALL (*)    Bilirubin Urine SMALL (*)    Protein, ur 30 (*)    All other components within normal limits  RESP PANEL BY RT-PCR (FLU A&B, COVID) ARPGX2  LIPASE, BLOOD  CBC    EKG None  Radiology No results found.  Procedures Procedures   Medications Ordered in ED Medications  sodium chloride 0.9 % bolus 1,000 mL (has no administration in time range)  ondansetron (ZOFRAN) injection 4 mg (has no administration in time range)    ED Course  I have reviewed the triage vital signs and the nursing notes.  Pertinent labs & imaging results that were available during my care of the patient were reviewed by me and considered in my medical decision making (see chart for details).    MDM Rules/Calculators/A&P                           Patient presents to the ED with complaints of N/V/D x 2-3 days. Patient is nontoxic, vitals with elevated BP. MM are mildly dry. Abdomen is soft, nontender, no peritoneal signs.   Additional history obtained:  Additional history obtained from chart review & nursing note review.   Lab Tests:  I reviewed and interpreted labs, which included:  CBC: Unremarkable.  CMP: Mild total protein elevation. Lipase: Within normal limits Urinalysis: Hemoglobin present, no  UTI.  ED Course:  Patient's labs are overall reassuring.  His abdomen is soft, completely nontender, and has no peritoneal signs, I have a low suspicion for acute surgical process such as appendicitis, cholecystitis, perforation, or obstruction.  Plan for IV fluids, Zofran, and p.o. challenge. Patient updated on results & plan of care- in agreement.   Patient care signed out to oncoming provider PA Blue @ shift change pending medication administration and PO challenge,  anticipate discharge home.   Portions of this note were generated with Scientist, clinical (histocompatibility and immunogenetics). Dictation errors may occur despite best attempts at proofreading.   Final Clinical Impression(s) / ED Diagnoses Final diagnoses:  Nausea vomiting and diarrhea    Rx / DC Orders ED Discharge Orders     None        Cherly Anderson, PA-C 11/18/21 0655    Dione Booze, MD 11/18/21 2248

## 2021-11-18 NOTE — ED Triage Notes (Signed)
Pt reports emesis and diarrhea x3 days. Pt states 5 episodes of emesis in the last 24 hours and 8 episodes of diarrhea in the last 24 hours. Pt has not tried any medications for chief complaint. Pt is A&Ox4. Pt denies fever, HA, body aches.

## 2021-11-18 NOTE — ED Provider Notes (Signed)
Care transferred from [facility], PA-C at time of sign out. See their note for full assessment.   HPI: Matthew Schroeder is a 25 y.o. male with a history of HIV and bipolar disorder who presents to the emergency department with complaints of nausea, vomiting, and diarrhea for the past few days.  Patient reports approximately 2 episodes of emesis per day and 5-10 episodes of diarrhea.  Describes diarrhea as fairly watery.  No alleviating or aggravating factors to his symptoms.  He has had some mild nasal congestion.  He denies fever, chills, melena, hematochezia, dysuria, or rashes.  Denies recent foreign travel or antibiotics.  Physical Exam  BP (!) 157/110    Pulse 77    Temp 98 F (36.7 C) (Oral)    Resp 12    Ht 5\' 4"  (1.626 m)    Wt 96.6 kg    SpO2 99%    BMI 36.56 kg/m   Physical Exam Vitals and nursing note reviewed.  Constitutional:      General: He is not in acute distress.    Appearance: He is not diaphoretic.  HENT:     Head: Normocephalic and atraumatic.     Mouth/Throat:     Pharynx: No oropharyngeal exudate.  Eyes:     General: No scleral icterus.    Conjunctiva/sclera: Conjunctivae normal.  Cardiovascular:     Rate and Rhythm: Normal rate and regular rhythm.     Pulses: Normal pulses.     Heart sounds: Normal heart sounds.  Pulmonary:     Effort: Pulmonary effort is normal. No respiratory distress.     Breath sounds: Normal breath sounds. No wheezing.  Abdominal:     General: Bowel sounds are normal.     Palpations: Abdomen is soft. There is no mass.     Tenderness: There is no abdominal tenderness. There is no guarding or rebound.     Comments: No abdominal tenderness to palpation.  Musculoskeletal:        General: Normal range of motion.     Cervical back: Normal range of motion and neck supple.  Skin:    General: Skin is warm and dry.  Neurological:     Mental Status: He is alert.  Psychiatric:        Behavior: Behavior normal.    ED  Course/Procedures   Clinical Course as of 11/18/21 11/20/21  [facility] Nov 18, 2021  0820 Patient reevaluated and sitting comfortably on stretcher.  Discussed with patient lab findings.  Discussed with patient will p.o. challenge prior to discharge.  Patient agreeable at this time. [SB]    Clinical Course User Index [SB] Makar Slatter A, PA-C   No results found.  Results for orders placed or performed during the hospital encounter of 11/18/21  Resp Panel by RT-PCR (Flu A&B, Covid) Nasopharyngeal Swab   Specimen: Nasopharyngeal Swab; Nasopharyngeal(NP) swabs in vial transport medium  Result Value Ref Range   SARS Coronavirus 2 by RT PCR NEGATIVE NEGATIVE   Influenza A by PCR NEGATIVE NEGATIVE   Influenza B by PCR NEGATIVE NEGATIVE  Lipase, blood  Result Value Ref Range   Lipase 30 11 - 51 U/L  Comprehensive metabolic panel  Result Value Ref Range   Sodium 136 135 - 145 mmol/L   Potassium 3.5 3.5 - 5.1 mmol/L   Chloride 103 98 - 111 mmol/L   CO2 22 22 - 32 mmol/L   Glucose, Bld 103 (H) 70 - 99 mg/dL   BUN 14 6 -  20 mg/dL   Creatinine, Ser 9.79 0.61 - 1.24 mg/dL   Calcium 9.3 8.9 - 48.0 mg/dL   Total Protein 9.0 (H) 6.5 - 8.1 g/dL   Albumin 4.6 3.5 - 5.0 g/dL   AST 31 15 - 41 U/L   ALT 18 0 - 44 U/L   Alkaline Phosphatase 66 38 - 126 U/L   Total Bilirubin 0.6 0.3 - 1.2 mg/dL   GFR, Estimated >16 >55 mL/min   Anion gap 11 5 - 15  CBC  Result Value Ref Range   WBC 7.3 4.0 - 10.5 K/uL   RBC 5.66 4.22 - 5.81 MIL/uL   Hemoglobin 16.2 13.0 - 17.0 g/dL   HCT 37.4 82.7 - 07.8 %   MCV 85.9 80.0 - 100.0 fL   MCH 28.6 26.0 - 34.0 pg   MCHC 33.3 30.0 - 36.0 g/dL   RDW 67.5 44.9 - 20.1 %   Platelets 361 150 - 400 K/uL   nRBC 0.0 0.0 - 0.2 %  Urinalysis, Routine w reflex microscopic Urine, Clean Catch  Result Value Ref Range   Color, Urine YELLOW (A) YELLOW   APPearance CLEAR (A) CLEAR   Specific Gravity, Urine >=1.030 1.005 - 1.030   pH 6.0 5.0 - 8.0   Glucose, UA NEGATIVE NEGATIVE  mg/dL   Hgb urine dipstick SMALL (A) NEGATIVE   Bilirubin Urine SMALL (A) NEGATIVE   Ketones, ur NEGATIVE NEGATIVE mg/dL   Protein, ur 30 (A) NEGATIVE mg/dL   Nitrite NEGATIVE NEGATIVE   Leukocytes,Ua NEGATIVE NEGATIVE   No results found.   Procedures  MDM  Patient presents to the ED with nausea, vomiting, diarrhea x2-3 days.  Vital signs stable, patient afebrile.  On exam patient without acute cardiovascular, respiratory, abdominal exam findings.  No focal abdominal pain. Differential diagnosis includes viral gastroenteritis, COVID, or flu.  CBC, CMP, lipase, urinalysis unremarkable.  Patient given Zofran and IV fluids by previous provider with relief of his symptoms.  Patient able to tolerate p.o. fluid intake in the ED.  Patient presentation suspicious for viral etiology.   Plan from previous PA-C: PO challenge and discharge home with Rx Zofran sent by previous PA-C.  Supportive care measures and strict return precautions discussed with patient. Patient acknowledges and voices understanding at this time.  Patient appears well and safe for discharge.  Follow-up instructions as indicated in discharge paperwork.    Fatmata Legere A, PA-C 11/18/21 0071    Dione Booze, MD 11/18/21 2248

## 2021-11-18 NOTE — Discharge Instructions (Addendum)
You were seen in the emergency department today for vomiting and diarrhea.  Your blood work was overall reassuring, your protein level is mildly high and you do have some blood in your urine, please discuss this with your primary care provider and have these each rechecked, additionally have your blood pressure rechecked as it was elevated in the emergency department..   We suspect you have a viral GI illness, we are sending home with Zofran to take every hours as needed for nausea and vomiting as well as some diet guidelines to assist with diarrhea.  We have prescribed you new medication(s) today. Discuss the medications prescribed today with your pharmacist as they can have adverse effects and interactions with your other medicines including over the counter and prescribed medications. Seek medical evaluation if you start to experience new or abnormal symptoms after taking one of these medicines, seek care immediately if you start to experience difficulty breathing, feeling of your throat closing, facial swelling, or rash as these could be indications of a more serious allergic reaction  Please follow-up with primary care within 3 days.  Additionally follow-up with your infectious disease doctor.   Return to the emergency department for new or worsening symptoms including but not limited to new or worsening pain, fever, inability to keep fluids down, dark or tarry stools, blood in your stool, passing out, or any other concerns.

## 2021-11-27 ENCOUNTER — Ambulatory Visit (INDEPENDENT_AMBULATORY_CARE_PROVIDER_SITE_OTHER): Payer: Medicare HMO | Admitting: Internal Medicine

## 2021-11-27 ENCOUNTER — Other Ambulatory Visit (HOSPITAL_COMMUNITY): Payer: Self-pay

## 2021-11-27 ENCOUNTER — Encounter: Payer: Self-pay | Admitting: Internal Medicine

## 2021-11-27 ENCOUNTER — Other Ambulatory Visit: Payer: Self-pay

## 2021-11-27 VITALS — BP 130/83 | HR 80 | Temp 98.0°F | Ht 64.0 in | Wt 202.0 lb

## 2021-11-27 DIAGNOSIS — B20 Human immunodeficiency virus [HIV] disease: Secondary | ICD-10-CM | POA: Diagnosis not present

## 2021-11-27 DIAGNOSIS — Z79899 Other long term (current) drug therapy: Secondary | ICD-10-CM | POA: Diagnosis not present

## 2021-11-27 DIAGNOSIS — Z23 Encounter for immunization: Secondary | ICD-10-CM | POA: Diagnosis not present

## 2021-11-27 MED ORDER — GENVOYA 150-150-200-10 MG PO TABS
1.0000 | ORAL_TABLET | Freq: Every day | ORAL | 5 refills | Status: DC
  Filled 2021-11-27 – 2021-11-28 (×2): qty 30, 30d supply, fill #0
  Filled 2021-12-19: qty 30, 30d supply, fill #1
  Filled 2022-01-14 – 2022-01-25 (×2): qty 30, 30d supply, fill #2
  Filled 2022-02-14: qty 30, 30d supply, fill #3
  Filled 2022-03-14: qty 30, 30d supply, fill #4
  Filled 2022-04-15 – 2022-05-06 (×2): qty 30, 30d supply, fill #5

## 2021-11-27 NOTE — Progress Notes (Signed)
[facility]: follow up for hiv disease  Patient ID: Matthew Schroeder, male   DOB: [birth year], 26 y.o.   MRN: [id number]  HPI Levi is a 26yo M with well controlled HIV disease and bipolar disease, hx of syphilis, CD 4 count 724/VL 3930 in dec 2022(previously undetectable) previously on genvoya. Has been off of his hiv regimen for 6 wks.   Outpatient Encounter Medications as of 11/27/2021  Medication Sig   benzonatate (TESSALON) 100 MG capsule Take 1 capsule (100 mg total) by mouth every 8 (eight) hours.   elvitegravir-cobicistat-emtricitabine-tenofovir (GENVOYA) 150-150-200-10 MG TABS tablet Take 1 tablet by mouth daily. with food   famotidine (PEPCID) 40 MG tablet Take 1 tablet (40 mg total) by mouth at bedtime.   ondansetron (ZOFRAN-ODT) 4 MG disintegrating tablet Take 1 tablet (4 mg total) by mouth every 8 (eight) hours as needed for nausea or vomiting.   pantoprazole (PROTONIX) 20 MG tablet Take 1 tablet (20 mg total) by mouth daily.   phenol (PHENASEPTIC) 1.4 % LIQD Use as directed 1 spray in the mouth or throat as needed for throat irritation / pain.   Facility-Administered Encounter Medications as of 11/27/2021  Medication   azithromycin (ZITHROMAX) tablet 1,000 mg   cefTRIAXone (ROCEPHIN) injection 250 mg   penicillin g benzathine (BICILLIN LA) 1200000 UNIT/2ML injection 1.2 Million Units     Patient Active Problem List   Diagnosis Date Noted   Healthcare maintenance 11/20/2018   Mixed bipolar I disorder (HCC) 07/16/2016   Attention deficit hyperactivity disorder (ADHD) 07/16/2016   HIV (human immunodeficiency virus infection) (HCC) 06/08/2016     Health Maintenance Due  Topic Date Due   COVID-19 Vaccine (1) Never done   Hepatitis C Screening  Never done   INFLUENZA VACCINE  06/25/2021     Review of Systems  Constitutional: Negative for fever, chills, diaphoresis, activity change, appetite change, fatigue and unexpected weight change.  HENT: Negative for congestion, sore  throat, rhinorrhea, sneezing, trouble swallowing and sinus pressure.  Eyes: Negative for photophobia and visual disturbance.  Respiratory: Negative for cough, chest tightness, shortness of breath, wheezing and stridor.  Cardiovascular: Negative for chest pain, palpitations and leg swelling.  Gastrointestinal: Negative for nausea, vomiting, abdominal pain, diarrhea, constipation, blood in stool, abdominal distention and anal bleeding.  Genitourinary: Negative for dysuria, hematuria, flank pain and difficulty urinating.  Musculoskeletal: Negative for myalgias, back pain, joint swelling, arthralgias and gait problem.  Skin: Negative for color change, pallor, rash and wound.  Neurological: Negative for dizziness, tremors, weakness and light-headedness.  Hematological: Negative for adenopathy. Does not bruise/bleed easily.  Psychiatric/Behavioral: Negative for behavioral problems, confusion, sleep disturbance, dysphoric mood, decreased concentration and agitation.   Physical Exam   BP 130/83   Pulse 80   Temp 98 F (36.7 C)   Ht 5\' 4"  (1.626 m)   Wt 202 lb (91.6 kg)   BMI 34.67 kg/m  Physical Exam  Constitutional: He is oriented to person, place, and time. He appears well-developed and well-nourished. No distress.  HENT:  Mouth/Throat: Oropharynx is clear and moist. No oropharyngeal exudate.  Cardiovascular: Normal rate, regular rhythm and normal heart sounds. Exam reveals no gallop and no friction rub.  No murmur heard.  Pulmonary/Chest: Effort normal and breath sounds normal. No respiratory distress. He has no wheezes.  Abdominal: Soft. Bowel sounds are normal. He exhibits no distension. There is no tenderness.  Lymphadenopathy:  He has no cervical adenopathy.  Neurological: He is alert and oriented to person, place, and time.  Skin: Skin is warm and dry. No rash noted. No erythema.  Psychiatric: He has a normal mood and affect. His behavior is normal.   Lab Results  Component Value  Date   CD4TCELL 41 11/13/2021   Lab Results  Component Value Date   CD4TABS 724 11/13/2021   CD4TABS 1,278 03/21/2021   CD4TABS 984 09/12/2020   Lab Results  Component Value Date   HIV1RNAQUANT 3,930 (H) 11/13/2021   No results found for: HEPBSAB Lab Results  Component Value Date   LABRPR REACTIVE (A) 11/13/2021    CBC Lab Results  Component Value Date   WBC 7.3 11/18/2021   RBC 5.66 11/18/2021   HGB 16.2 11/18/2021   HCT 48.6 11/18/2021   PLT 361 11/18/2021   MCV 85.9 11/18/2021   MCH 28.6 11/18/2021   MCHC 33.3 11/18/2021   RDW 14.0 11/18/2021   LYMPHSABS 1,805 11/13/2021   MONOABS 0.7 05/05/2018   EOSABS 82 11/13/2021    BMET Lab Results  Component Value Date   NA 136 11/18/2021   K 3.5 11/18/2021   CL 103 11/18/2021   CO2 22 11/18/2021   GLUCOSE 103 (H) 11/18/2021   BUN 14 11/18/2021   CREATININE 1.15 11/18/2021   CALCIUM 9.3 11/18/2021   GFRNONAA >60 11/18/2021   GFRAA 106 03/21/2021      Assessment and Plan HIV disease - lets get back on genvoya  Long term medicaiton management = will check cr  Health maintenance =will check sti - has not had monkeypox vaccine but also - needs covid booster and flu vaccine today

## 2021-11-28 ENCOUNTER — Other Ambulatory Visit (HOSPITAL_COMMUNITY): Payer: Self-pay

## 2021-12-03 ENCOUNTER — Emergency Department (HOSPITAL_COMMUNITY)
Admission: EM | Admit: 2021-12-03 | Discharge: 2021-12-03 | Discharge status: Against Medical Advice | Payer: Medicare HMO | Source: Home / Self Care

## 2021-12-05 LAB — HIV RNA, RTPCR W/R GT (RTI, PI,INT)
HIV 1 RNA Quant: 15200 copies/mL — ABNORMAL HIGH
HIV-1 RNA Quant, Log: 4.18 Log copies/mL — ABNORMAL HIGH

## 2021-12-05 LAB — HIV-1 INTEGRASE GENOTYPE

## 2021-12-05 LAB — HIV-1 GENOTYPE: HIV-1 Genotype: DETECTED — AB

## 2021-12-06 ENCOUNTER — Other Ambulatory Visit (HOSPITAL_COMMUNITY): Payer: Self-pay

## 2021-12-09 ENCOUNTER — Encounter (HOSPITAL_COMMUNITY): Payer: Self-pay | Admitting: Emergency Medicine

## 2021-12-09 ENCOUNTER — Other Ambulatory Visit: Payer: Self-pay

## 2021-12-09 ENCOUNTER — Emergency Department (HOSPITAL_COMMUNITY)
Admission: EM | Admit: 2021-12-09 | Discharge: 2021-12-09 | Disposition: A | Discharge status: Home / Self Care | Payer: Medicare HMO | Attending: Emergency Medicine | Admitting: Emergency Medicine

## 2021-12-09 DIAGNOSIS — M542 Cervicalgia: Secondary | ICD-10-CM | POA: Insufficient documentation

## 2021-12-09 DIAGNOSIS — Y92410 Unspecified street and highway as the place of occurrence of the external cause: Secondary | ICD-10-CM | POA: Diagnosis not present

## 2021-12-09 MED ORDER — CYCLOBENZAPRINE HCL 10 MG PO TABS
10.0000 mg | ORAL_TABLET | Freq: Every evening | ORAL | 0 refills | Status: DC | PRN
Start: 2021-12-09 — End: 2022-01-24
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
## 2021-12-17 ENCOUNTER — Other Ambulatory Visit (HOSPITAL_COMMUNITY): Payer: Self-pay

## 2021-12-19 ENCOUNTER — Other Ambulatory Visit (HOSPITAL_COMMUNITY): Payer: Self-pay

## 2021-12-21 ENCOUNTER — Other Ambulatory Visit (HOSPITAL_COMMUNITY): Payer: Self-pay

## 2022-01-08 ENCOUNTER — Ambulatory Visit: Payer: Medicaid Other | Admitting: Internal Medicine

## 2022-01-14 ENCOUNTER — Other Ambulatory Visit (HOSPITAL_COMMUNITY): Payer: Self-pay

## 2022-01-17 ENCOUNTER — Other Ambulatory Visit (HOSPITAL_COMMUNITY): Payer: Self-pay

## 2022-01-24 ENCOUNTER — Other Ambulatory Visit (HOSPITAL_COMMUNITY): Payer: Self-pay

## 2022-01-24 ENCOUNTER — Encounter: Payer: Self-pay | Admitting: Physician Assistant

## 2022-01-24 ENCOUNTER — Telehealth: Payer: Medicare HMO | Admitting: Physician Assistant

## 2022-01-24 DIAGNOSIS — K644 Residual hemorrhoidal skin tags: Secondary | ICD-10-CM

## 2022-01-24 MED ORDER — HYDROCORTISONE 2.5 % EX CREA
TOPICAL_CREAM | Freq: Two times a day (BID) | CUTANEOUS | 0 refills | Status: DC
  Filled 2022-01-24: qty 30, 10d supply, fill #0

## 2022-01-24 NOTE — Progress Notes (Signed)
?Virtual Visit Consent  ? ?Matthew Schroeder, you are scheduled for a virtual visit with a [facility] provider today.   ?  ?Just as with appointments in the office, your consent must be obtained to participate.  Your consent will be active for this visit and any virtual visit you may have with one of our providers in the next 365 days.   ?  ?If you have a MyChart account, a copy of this consent can be sent to you electronically.  All virtual visits are billed to your insurance company just like a traditional visit in the office.   ? ?As this is a virtual visit, video technology does not allow for your provider to perform a traditional examination.  This may limit your provider's ability to fully assess your condition.  If your provider identifies any concerns that need to be evaluated in person or the need to arrange testing (such as labs, EKG, etc.), we will make arrangements to do so.   ?  ?Although advances in technology are sophisticated, we cannot ensure that it will always work on either your end or our end.  If the connection with a video visit is poor, the visit may have to be switched to a telephone visit.  With either a video or telephone visit, we are not always able to ensure that we have a secure connection.    ? ?I need to obtain your verbal consent now.   Are you willing to proceed with your visit today?  ?  ?Matthew Schroeder has provided verbal consent on 01/24/2022 for a virtual visit (video or telephone). ?  ?Piedad Climes, PA-C  ? ?Date: 01/24/2022 4:28 PM ? ? ?Virtual Visit via Video Note  ? ?Matthew Schroeder, connected with  Matthew Schroeder  ([id number], 12/12/1995) on 01/24/22 at  3:30 PM EST by a video-enabled telemedicine application and verified that I am speaking with the correct person using two identifiers. ? ?Location: ?Patient: Virtual Visit Location Patient: Parked Car ?Provider: Virtual Visit Location Provider: Home Office ?  ?I discussed the limitations of evaluation and  management by telemedicine and the availability of in person appointments. The patient expressed understanding and agreed to proceed.   ? ?History of Present Illness: ?Matthew Schroeder is a 26 y.o. who identifies as a male who was assigned male at birth, and is being seen today for a bump on his anus first noted in the past 24 hours. Denies any pain or tenderness. Has noted some brighht blood when wiping recently, especially after straining for bowel movements. Denies noting more than one area. The area feels soft to him. . ? ?HPI: HPI  ?Problems:  ?Patient Active Problem List  ? Diagnosis Date Noted  ? Healthcare maintenance 11/20/2018  ? Mixed bipolar I disorder (HCC) 07/16/2016  ? Attention deficit hyperactivity disorder (ADHD) 07/16/2016  ? HIV (human immunodeficiency virus infection) (HCC) 06/08/2016  ?  ?Allergies: No Known Allergies ?Medications:  ?Current Outpatient Medications:  ?  elvitegravir-cobicistat-emtricitabine-tenofovir (GENVOYA) 150-150-200-10 MG TABS tablet, Take 1 tablet by mouth daily. with food, Disp: 30 tablet, Rfl: 5 ? ?Observations/Objective: ?Patient is well-developed, well-nourished in no acute distress.  ?Resting comfortably in parked car. ?Head is normocephalic, atraumatic.  ?No labored breathing. ?Speech is clear and coherent with logical content.  ?Patient is alert and oriented at baseline.  ? ? ? ? ?Assessment and Plan: ?1. External hemorrhoid ? ?Based on history and what can be seen from picture this seems  most likely external hemorrhoid. Supportive measures and bowel regimen reviewed. Start hydrocortisone rectal cream to the area twice daily for 7 days. If not improving or any new or worsening symptoms, needs in-person evaluation especially giving other risks from HIV status.  ? ?Follow Up Instructions: ?I discussed the assessment and treatment plan with the patient. The patient was provided an opportunity to ask questions and all were answered. The patient agreed with the plan  and demonstrated an understanding of the instructions.  A copy of instructions were sent to the patient via MyChart unless otherwise noted below.  ? ?The patient was advised to call back or seek an in-person evaluation if the symptoms worsen or if the condition fails to improve as anticipated. ? ?Time:  ?I spent 10 minutes with the patient via telehealth technology discussing the above problems/concerns.   ? ?Piedad Climes, PA-C ?

## 2022-01-24 NOTE — Patient Instructions (Signed)
?  Matthew Schroeder, thank you for joining Matthew Rio, PA-C for today's virtual visit.  While this provider is not your primary care provider (PCP), if your PCP is located in our provider database this encounter information will be shared with them immediately following your visit. ? ?Consent: ?(Patient) Matthew Schroeder provided verbal consent for this virtual visit at the beginning of the encounter. ? ?Current Medications: ? ?Current Outpatient Medications:  ?  hydrocortisone 2.5 % cream, Apply topically 2 (two) times daily., Disp: 30 g, Rfl: 0 ?  elvitegravir-cobicistat-emtricitabine-tenofovir (GENVOYA) 150-150-200-10 MG TABS tablet, Take 1 tablet by mouth daily. with food, Disp: 30 tablet, Rfl: 5  ? ?Medications ordered in this encounter:  ?Meds ordered this encounter  ?Medications  ? hydrocortisone 2.5 % cream  ?  Sig: Apply topically 2 (two) times daily.  ?  Dispense:  30 g  ?  Refill:  0  ?  Order Specific Question:   Supervising Provider  ?  Answer:   Noemi Chapel [[id number]]  ?  ? ?*If you need refills on other medications prior to your next appointment, please contact your pharmacy* ? ?Follow-Up: ?Call back or seek an in-person evaluation if the symptoms worsen or if the condition fails to improve as anticipated. ? ?Other Instructions ?Please apply cream twice daily for a week. Follow the recommendations below to help with constipation and straining. If not improving/resolving or any new/worsening symptoms, you need to be evaluated in person, especially giving HIV status and other associated risks. Do not delay care! ? ?I encourage you to increase hydration and the amount of fiber in your diet.  Start a daily probiotic (Align, Culturelle, Digestive Advantage, etc.). If no bowel movement within 24 hours, take 2 Tbs of Milk of Magnesia in a 4 oz glass of warmed prune juice every 2-3 days to help promote bowel movement. If no results within 24 hours, then repeat above regimen, adding a Dulcolax stool  softener to regimen. If this does not promote a bowel movement, please call the office. ? ? ? ?If you have been instructed to have an in-person evaluation today at a local Urgent Care facility, please use the link below. It will take you to a list of all of our available [facility], including address, phone number and hours of operation. Please do not delay care.  ?[facility] ? ?If you or a family member do not have a primary care provider, use the link below to schedule a visit and establish care. When you choose a [facility] primary care physician or advanced practice provider, you gain a long-term partner in health. ?Find a Primary Care Provider ? ?Learn more about [facility]'s in-office and virtual care options: ?[facility]  ?

## 2022-01-25 ENCOUNTER — Other Ambulatory Visit (HOSPITAL_COMMUNITY): Payer: Self-pay

## 2022-02-07 ENCOUNTER — Other Ambulatory Visit (HOSPITAL_COMMUNITY): Payer: Self-pay

## 2022-02-07 ENCOUNTER — Telehealth: Payer: Self-pay

## 2022-02-07 NOTE — Telephone Encounter (Signed)
Called patient to offer appointment, no answer. Left HIPAA compliant voicemail requesting callback.   Sherriann Szuch D Ameriah Lint, RN  

## 2022-02-11 ENCOUNTER — Other Ambulatory Visit (HOSPITAL_COMMUNITY): Payer: Self-pay

## 2022-02-14 ENCOUNTER — Other Ambulatory Visit (HOSPITAL_COMMUNITY): Payer: Self-pay

## 2022-02-18 ENCOUNTER — Other Ambulatory Visit (HOSPITAL_COMMUNITY): Payer: Self-pay

## 2022-02-23 ENCOUNTER — Encounter (HOSPITAL_COMMUNITY): Payer: Self-pay

## 2022-02-23 ENCOUNTER — Ambulatory Visit (HOSPITAL_COMMUNITY)
Admission: EM | Admit: 2022-02-23 | Discharge: 2022-02-23 | Disposition: A | Discharge status: Home / Self Care | Payer: Medicare HMO | Attending: Urgent Care | Admitting: Urgent Care

## 2022-02-23 ENCOUNTER — Other Ambulatory Visit: Payer: Self-pay

## 2022-02-23 DIAGNOSIS — K629 Disease of anus and rectum, unspecified: Secondary | ICD-10-CM

## 2022-02-23 DIAGNOSIS — B20 Human immunodeficiency virus [HIV] disease: Secondary | ICD-10-CM | POA: Diagnosis not present

## 2022-02-23 MED ORDER — HYDROCORTISONE (PERIANAL) 2.5 % EX CREA
1.0000 | TOPICAL_CREAM | Freq: Two times a day (BID) | CUTANEOUS | 0 refills | Status: DC
Start: 2022-02-23 — End: 2023-10-02
  Filled 2022-02-23: qty 30, 30d supply, fill #0

## 2022-02-23 NOTE — ED Triage Notes (Signed)
Pt c/o possible hemorrhoid that he noticed a few weeks ago. ?

## 2022-02-23 NOTE — Discharge Instructions (Addendum)
Your symptoms are most likely related to a hemorrhoid. ?Please start using the rectal cream twice daily. ?The suppository is not covered with your insurance. ?You may use a sitz bath. ?Increase your water intake and fiber from natural fruits and vegetables.  Make sure you are not straining or constipated. ?If your stools are hard, you may take over-the-counter MiraLAX or Colace. ?Should your lesion remain for greater than 2 to 3 weeks, please follow-up with your PCP for an HPV evaluation. ? ?

## 2022-02-23 NOTE — ED Provider Notes (Signed)
?[facility] ? ? ? ?CSN: [id number] ?Arrival date & time: 02/23/22  1637 ? ? ?  ? ?History   ?Chief Complaint ?Chief Complaint  ?Patient presents with  ? Hemorrhoids  ? ? ?HPI ?Matthew Schroeder is a 26 y.o. male.  ? ?25yo male pt presents with concerns of a single non-painful "lump" in his anal area. He states it has been there for several weeks. He was seen for it at another location and prescribed hydrocortisone 2.5% rectal cream. He never filled the medication nor started it because he thought the only indication to use this was pain. He denies any pain to the lesion. He reports it has not gotten larger nor smaller. He admits to intermittent hard stools and constipation. He denies any blood in the stool. He does have hx of HIV and does participate in anal intercourse.  ? ? ? ?Past Medical History:  ?Diagnosis Date  ? HIV disease (HCC)   ? ? ?Patient Active Problem List  ? Diagnosis Date Noted  ? Healthcare maintenance 11/20/2018  ? Mixed bipolar I disorder (HCC) 07/16/2016  ? Attention deficit hyperactivity disorder (ADHD) 07/16/2016  ? HIV (human immunodeficiency virus infection) (HCC) 06/08/2016  ? ? ?History reviewed. No pertinent surgical history. ? ? ? ? ?Home Medications   ? ?Prior to Admission medications   ?Medication Sig Start Date End Date Taking? Authorizing Provider  ?hydrocortisone (PROCTOZONE-HC) 2.5 % rectal cream Place 1 application. rectally 2 (two) times daily. 02/23/22  Yes Meilech Virts L, PA  ?elvitegravir-cobicistat-emtricitabine-tenofovir (GENVOYA) 150-150-200-10 MG TABS tablet Take 1 tablet by mouth daily. with food 11/27/21   Judyann Munson, MD  ? ? ?Family History ?History reviewed. No pertinent family history. ? ?Social History ?Social History  ? ?Tobacco Use  ? Smoking status: Never  ? Smokeless tobacco: Never  ?Substance Use Topics  ? Alcohol use: No  ? Drug use: No  ? ? ? ?Allergies   ?Patient has no known allergies. ? ? ?Review of Systems ?Review of Systems  ?Skin:   ?      Anal lesion  ? ? ?Physical Exam ?Triage Vital Signs ?ED Triage Vitals [02/23/22 1758]  ?Enc Vitals Group  ?   BP (!) 141/79  ?   Pulse Rate 74  ?   Resp 20  ?   Temp 98.6 ?F (37 ?C)  ?   Temp Source Oral  ?   SpO2 100 %  ?   Weight   ?   Height   ?   Head Circumference   ?   Peak Flow   ?   Pain Score 0  ?   Pain Loc   ?   Pain Edu?   ?   Excl. in GC?   ? ?No data found. ? ?Updated Vital Signs ?BP (!) 141/79 (BP Location: Left Arm)   Pulse 74   Temp 98.6 ?F (37 ?C) (Oral)   Resp 20   SpO2 100%  ? ?Visual Acuity ?Right Eye Distance:   ?Left Eye Distance:   ?Bilateral Distance:   ? ?Right Eye Near:   ?Left Eye Near:    ?Bilateral Near:    ? ?Physical Exam ?Vitals and nursing note reviewed. Chaperone present: deferred.  ?Constitutional:   ?   General: He is not in acute distress. ?   Appearance: Normal appearance. He is obese. He is not toxic-appearing or diaphoretic.  ?HENT:  ?   Head: Normocephalic and atraumatic.  ?Abdominal:  ?  General: Abdomen is flat. Bowel sounds are normal. There is no distension.  ?   Palpations: Abdomen is soft. There is no mass.  ?   Tenderness: There is no abdominal tenderness. There is no guarding or rebound.  ?Genitourinary: ?   Comments: Single lesion to 12 oclock around the anus. It does increase in size with valsalva. There is no fissure. No bleeding. ?Skin: ?   General: Skin is warm.  ?   Capillary Refill: Capillary refill takes less than 2 seconds.  ?   Findings: Lesion (as above) present. No erythema or rash.  ?Neurological:  ?   General: No focal deficit present.  ?   Mental Status: He is alert and oriented to person, place, and time.  ? ? ? ?UC Treatments / Results  ?Labs ?(all labs ordered are listed, but only abnormal results are displayed) ?Labs Reviewed - No data to display ? ?EKG ? ? ?Radiology ?No results found. ? ?Procedures ?Procedures (including critical care time) ? ?Medications Ordered in UC ?Medications - No data to display ? ?Initial Impression / Assessment and  Plan / UC Course  ?I have reviewed the triage vital signs and the nursing notes. ? ?Pertinent labs & imaging results that were available during my care of the patient were reviewed by me and considered in my medical decision making (see chart for details). ? ?  ? ?Anal lesion - discussed with pt that clinically this does look consistent with a hemorrhoid, however it has not regressed in size over several weeks. Pt never filled the topical cream because he thought he was only to use it if it hurt. I recommended filling and using to see if the lesion, a suspected hemorrhoid, can shrink in size. Also recommended sitz baths and increased hydration/ fiber. Should symptoms persist or worsen, I did discuss the possibility of this being an anal wart given his risk factors. Would recommend referral to specialist for HPV eval.  ? ?Final Clinical Impressions(s) / UC Diagnoses  ? ?Final diagnoses:  ?Anal lesion  ? ? ? ?Discharge Instructions   ? ?  ?Your symptoms are most likely related to a hemorrhoid. ?Please start using the rectal cream twice daily. ?The suppository is not covered with your insurance. ?You may use a sitz bath. ?Increase your water intake and fiber from natural fruits and vegetables.  Make sure you are not straining or constipated. ?If your stools are hard, you may take over-the-counter MiraLAX or Colace. ?Should your lesion remain for greater than 2 to 3 weeks, please follow-up with your PCP for an HPV evaluation. ? ? ? ? ? ?ED Prescriptions   ? ? Medication Sig Dispense Auth. Provider  ? hydrocortisone (PROCTOZONE-HC) 2.5 % rectal cream Place 1 application. rectally 2 (two) times daily. 30 g Mariel Gaudin, [place] L, [place]  ? ?  ? ?PDMP not reviewed this encounter. ?  Maretta Bees, [place] ?02/24/22 1923 ? ?

## 2022-02-25 ENCOUNTER — Other Ambulatory Visit (HOSPITAL_COMMUNITY): Payer: Self-pay

## 2022-03-05 ENCOUNTER — Other Ambulatory Visit (HOSPITAL_COMMUNITY): Payer: Self-pay

## 2022-03-12 ENCOUNTER — Other Ambulatory Visit (HOSPITAL_COMMUNITY): Payer: Self-pay

## 2022-03-14 ENCOUNTER — Other Ambulatory Visit (HOSPITAL_COMMUNITY): Payer: Self-pay

## 2022-03-21 ENCOUNTER — Encounter: Payer: Self-pay | Admitting: Physician Assistant

## 2022-03-21 ENCOUNTER — Ambulatory Visit (INDEPENDENT_AMBULATORY_CARE_PROVIDER_SITE_OTHER): Payer: Medicare HMO | Admitting: Physician Assistant

## 2022-03-21 ENCOUNTER — Other Ambulatory Visit (HOSPITAL_COMMUNITY): Payer: Self-pay

## 2022-03-21 VITALS — BP 136/88 | HR 83 | Ht 64.0 in | Wt 203.4 lb

## 2022-03-21 DIAGNOSIS — K644 Residual hemorrhoidal skin tags: Secondary | ICD-10-CM

## 2022-03-21 NOTE — Progress Notes (Signed)
Agree with assessment and plan as outlined.  While I did not see this patient in the office with you it sounds like perhaps he has a skin tag, if he wants this electively removed he can see [facility] / surgery.  Thanks ?

## 2022-03-21 NOTE — Progress Notes (Signed)
? ?Chief Complaint: Follow-up ER visit for hemorrhoids ? ?HPI: ?   Matthew Schroeder is a 26 year old African-American male with a past medical history of HIV, who presents to clinic today after being seen in the ER for suspected hemorrhoid. ?   02/23/2022 patient seen in the ER for a single nonpainful "lump" in his anal area.  Apparently had been there for weeks and he was seen in a different location prescribed Hydrocortisone 2.5% rectal cream.  He had never filled it or started it.  At that time rectal exam with a single lesion to 12:00 around the anus which increased in size with Valsalva.  No fissure.  At that time there are some question about whether or not this was a hemorrhoid.  It was recommended that he feels Hydrocortisone cream and start using.  Also recommended sitz bath's. ?   Today, the patient presents to clinic and tells me that he has been seen at 4 different places for this "lesion" on his bottom.  Apparently initially seen via telemedicine and they thought possibly a hemorrhoid.  He was given Hydrocortisone ointment but never uses because it was not painful to him.  He was then seen in the ER as above and then told to start the Hydrocortisone.  He did this for a while but "it was getting on my underwear", so he discontinued use.  He has also been seen at the dermatologist recently who told him it was just excess skin.  He explains that it does not bother him at all other than cosmetically.  He thinks it is possibly shrunk in size slightly over the past few weeks.  He has no pain, bowel movements are normal, no irritation. ?   Denies fever, chills, weight loss, change in bowel habits, blood in his stool or abdominal pain. ? ?Past Medical History:  ?Diagnosis Date  ? HIV disease ([place])   ? ? ?Past Surgical History:  ?Procedure Laterality Date  ? NO PAST SURGERIES    ? ? ?Current Outpatient Medications  ?Medication Sig Dispense Refill  ? elvitegravir-cobicistat-emtricitabine-tenofovir (GENVOYA)  150-150-200-10 MG TABS tablet Take 1 tablet by mouth daily. with food 30 tablet 5  ? hydrocortisone (PROCTOZONE-HC) 2.5 % rectal cream Place 1 application rectally 2 (two) times daily. 30 g 0  ? ?No current facility-administered medications for this visit.  ? ? ?Allergies as of 03/21/2022  ? (No Known Allergies)  ? ? ?Family History  ?Problem Relation Age of Onset  ? Colon cancer Neg Hx   ? Esophageal cancer Neg Hx   ? Stomach cancer Neg Hx   ? ? ?Social History  ? ?Socioeconomic History  ? Marital status: Single  ?  Spouse name: Not on file  ? Number of children: 0  ? Years of education: Not on file  ? Highest education level: Not on file  ?Occupational History  ? Occupation: Phlebotomist  ?Tobacco Use  ? Smoking status: Never  ? Smokeless tobacco: Never  ?Vaping Use  ? Vaping Use: Never used  ?Substance and Sexual Activity  ? Alcohol use: No  ? Drug use: No  ? Sexual activity: Not Currently  ?  Partners: Male  ?  Birth control/protection: Condom  ?  Comment: declined condoms  ?Other Topics Concern  ? Not on file  ?Social History Narrative  ? ** Merged History Encounter **  ?    ? ?Social Determinants of Health  ? ?Financial Resource Strain: Not on file  ?Food Insecurity: Not on file  ?  Transportation Needs: Not on file  ?Physical Activity: Not on file  ?Stress: Not on file  ?Social Connections: Not on file  ?Intimate Partner Violence: Not on file  ? ? ?Review of Systems:    ?Constitutional: No weight loss, fever or chills ?Skin: No rash ?Cardiovascular: No chest pain ?Respiratory: No SOB ?Gastrointestinal: See HPI and otherwise negative ?Genitourinary: No dysuria  ?Neurological: No headache, dizziness or syncope ?Musculoskeletal: No new muscle or joint pain ?Hematologic: No bleeding or bruising ?Psychiatric: No history of depression or anxiety ? ? Physical Exam:  ?Vital signs: ?BP 136/88   Pulse 83   Ht 5\' 4"  (1.626 m)   Wt 203 lb 6.4 oz (92.3 kg)   SpO2 96%   BMI 34.91 kg/m?   ? ?Constitutional:   Pleasant AA  male appears to be in NAD, Well developed, Well nourished, alert and cooperative ?Head:  Normocephalic and atraumatic. ?Eyes:   PEERL, EOMI. No icterus. Conjunctiva pink. ?Ears:  Normal auditory acuity. ?Neck:  Supple ?Throat: Oral cavity and pharynx without inflammation, swelling or lesion.  ?Respiratory: Respirations even and unlabored. Lungs clear to auscultation bilaterally.   No wheezes, crackles, or rhonchi.  ?Cardiovascular: Normal S1, S2. No MRG. Regular rate and rhythm. No peripheral edema, cyanosis or pallor.  ?Gastrointestinal:  Soft, nondistended, nontender. No rebound or guarding. Normal bowel sounds. No appreciable masses or hepatomegaly. ?Rectal: External: Small hemorrhoid tag/excess tissue; internal: No abnormality ?Msk:  Symmetrical without gross deformities. Without edema, no deformity or joint abnormality.  ?Neurologic:  Alert and  oriented x4;  grossly normal neurologically.  ?Skin:   Dry and intact without significant lesions or rashes. ?Psychiatric: Demonstrates good judgement and reason without abnormal affect or behaviors. ? ?No recent labs or imaging. ? ?Assessment: ?1.  Hemorrhoid tag: Hemorrhoid tag which is causing no symptoms, but patient would like this removed for cosmetic purposes ? ?Plan: ?1.  Discussed with patient that I can refer him to [facility] for evaluation of possible removal of this, but they may or may not want to proceed given that he is not having any symptoms.  Certainly his insurance may not pay for this.  He verbalized understanding but wanted to be referred. ?2.  Patient to follow with [place] as needed in the future.  He was assigned to Dr. Havery Moros this morning. ? ?Ellouise Newer, PA-C ?[facility] ?03/21/2022, 10:35 AM ? ?Cc: No ref. provider found  ?

## 2022-03-21 NOTE — Patient Instructions (Signed)
REFERRAL: ? ?A referral, your demographics, a copy of your insurance card and your records will be sent to [facility]. You will receive a call from their office regarding the date, time and location of your appointment. ? ?If you are age 24 or older, your body mass index should be between 23-30. Your Body mass index is 34.91 kg/m?Marland Kitchen If this is out of the aforementioned range listed, please consider follow up with your Primary Care Provider. ? ?If you are age 102 or younger, your body mass index should be between 19-25. Your Body mass index is 34.91 kg/m?Marland Kitchen If this is out of the aformentioned range listed, please consider follow up with your Primary Care Provider.  ? ?________________________________________________________ ? ?The [facility] providers would like to encourage you to use [facility] to communicate with providers for non-urgent requests or questions.  Due to long hold times on the telephone, sending your provider a message by [facility] may be a faster and more efficient way to get a response.  Please allow 48 business hours for a response.  Please remember that this is for non-urgent requests.  ?_______________________________________________________ ? ? ?

## 2022-04-12 ENCOUNTER — Other Ambulatory Visit (HOSPITAL_COMMUNITY): Payer: Self-pay

## 2022-04-15 ENCOUNTER — Other Ambulatory Visit (HOSPITAL_COMMUNITY): Payer: Self-pay

## 2022-04-16 ENCOUNTER — Other Ambulatory Visit (HOSPITAL_COMMUNITY): Payer: Self-pay

## 2022-04-30 ENCOUNTER — Other Ambulatory Visit (HOSPITAL_COMMUNITY): Payer: Self-pay

## 2022-05-06 ENCOUNTER — Other Ambulatory Visit (HOSPITAL_COMMUNITY): Payer: Self-pay

## 2022-05-29 ENCOUNTER — Other Ambulatory Visit: Payer: Medicare HMO

## 2022-05-29 ENCOUNTER — Other Ambulatory Visit: Payer: Self-pay

## 2022-05-29 ENCOUNTER — Other Ambulatory Visit (HOSPITAL_COMMUNITY): Payer: Self-pay

## 2022-05-29 DIAGNOSIS — Z79899 Other long term (current) drug therapy: Secondary | ICD-10-CM

## 2022-05-29 DIAGNOSIS — Z21 Asymptomatic human immunodeficiency virus [HIV] infection status: Secondary | ICD-10-CM

## 2022-05-29 DIAGNOSIS — B20 Human immunodeficiency virus [HIV] disease: Secondary | ICD-10-CM

## 2022-05-29 DIAGNOSIS — Z113 Encounter for screening for infections with a predominantly sexual mode of transmission: Secondary | ICD-10-CM

## 2022-05-31 ENCOUNTER — Other Ambulatory Visit (HOSPITAL_COMMUNITY): Payer: Self-pay

## 2022-06-03 ENCOUNTER — Other Ambulatory Visit (HOSPITAL_COMMUNITY): Payer: Self-pay

## 2022-06-03 ENCOUNTER — Other Ambulatory Visit: Payer: Self-pay | Admitting: Internal Medicine

## 2022-06-03 DIAGNOSIS — B20 Human immunodeficiency virus [HIV] disease: Secondary | ICD-10-CM

## 2022-06-03 MED ORDER — GENVOYA 150-150-200-10 MG PO TABS
1.0000 | ORAL_TABLET | Freq: Every day | ORAL | 0 refills | Status: DC
  Filled 2022-06-03 – 2022-06-07 (×2): qty 30, 30d supply, fill #0

## 2022-06-07 ENCOUNTER — Other Ambulatory Visit (HOSPITAL_COMMUNITY): Payer: Self-pay

## 2022-06-08 ENCOUNTER — Other Ambulatory Visit (HOSPITAL_COMMUNITY): Payer: Self-pay

## 2022-06-10 ENCOUNTER — Other Ambulatory Visit: Payer: Medicare HMO

## 2022-06-11 ENCOUNTER — Encounter: Payer: Medicare HMO | Admitting: Internal Medicine

## 2022-06-13 ENCOUNTER — Other Ambulatory Visit (HOSPITAL_COMMUNITY): Payer: Self-pay

## 2022-06-20 ENCOUNTER — Telehealth: Payer: Medicare HMO | Admitting: Internal Medicine

## 2022-06-20 ENCOUNTER — Other Ambulatory Visit: Payer: Self-pay

## 2022-06-26 ENCOUNTER — Other Ambulatory Visit (HOSPITAL_COMMUNITY): Payer: Self-pay

## 2022-07-01 ENCOUNTER — Other Ambulatory Visit (HOSPITAL_COMMUNITY): Payer: Self-pay

## 2022-07-06 ENCOUNTER — Other Ambulatory Visit: Payer: Self-pay | Admitting: Internal Medicine

## 2022-07-06 ENCOUNTER — Other Ambulatory Visit (HOSPITAL_COMMUNITY): Payer: Self-pay

## 2022-07-06 DIAGNOSIS — B20 Human immunodeficiency virus [HIV] disease: Secondary | ICD-10-CM

## 2022-07-08 ENCOUNTER — Ambulatory Visit (INDEPENDENT_AMBULATORY_CARE_PROVIDER_SITE_OTHER): Payer: Medicare HMO | Admitting: Infectious Diseases

## 2022-07-08 ENCOUNTER — Other Ambulatory Visit: Payer: Self-pay

## 2022-07-08 ENCOUNTER — Encounter: Payer: Self-pay | Admitting: Infectious Diseases

## 2022-07-08 ENCOUNTER — Other Ambulatory Visit (HOSPITAL_COMMUNITY): Payer: Self-pay

## 2022-07-08 DIAGNOSIS — B20 Human immunodeficiency virus [HIV] disease: Secondary | ICD-10-CM | POA: Diagnosis not present

## 2022-07-08 MED ORDER — GENVOYA 150-150-200-10 MG PO TABS
1.0000 | ORAL_TABLET | Freq: Every day | ORAL | 5 refills | Status: DC
  Filled 2022-07-08: qty 30, 30d supply, fill #0

## 2022-07-08 NOTE — Telephone Encounter (Signed)
Appt 07/08/22

## 2022-07-08 NOTE — Assessment & Plan Note (Signed)
VL has been 3,000 >> 15,000 last 2 checks for 7 months. In further discussion about lab results from January he confided he is having a hard time with adherence. Very interested in Guinea injections. We explained today that this is best done as a stable switch and likely will not be approved by insurance unless that's the case. If we can get his VL < 50 I think we can proceed with the switch. He was educated on frequency of appointments, TWO injections every 2 months and viral load monitoring/testing during this transition.   Will have him back in 14m to see Dr. Drue Second to re-discuss. Given intermittent Genvoya use will repeat Genotype to ensure he is still a candidate for this treatment.   Sexual health and family planning needs discussed - no needs today.

## 2022-07-08 NOTE — Patient Instructions (Addendum)
Start taking the genvoya with your phentermine in the morning with a little something to eat.   If we can get your viral load back down < 50 with the pills we can see if your insurance will let us switch you to the cabenuva injections.   Please come back to see Dr. Drue Second again in 2 months to check in on timing / progress to switch. I will let you know what your viral load via mychart

## 2022-07-08 NOTE — Progress Notes (Signed)
Subjective:     Matthew Schroeder is a 26 y.o. male patient here today for routine HIV follow up care.   LOV with Dr. Drue Second January 2023 for routine follow up. His viral load was up 15,000 copies at that time on once daily Genvoya. Genotype at that visit was sensitive. He had a lot going on around that time of year and has had some struggles keeping up with his mediation at that time. He feels that this has gotten better over the last 1-2 months. Ran out of Genvoya recently and needed a refill. He is interested in Guinea injections to help with his adherence to medication. Has not been active with any partners.   Had ER visit for anal skin tag in April of this year.  Started phentermine for weight loss - going well and takes this every morning. No side effects from what he can tell      Review of Systems: Review of Systems  Constitutional:  Negative for chills and fever.  HENT:  Negative for sore throat.   Eyes:  Negative for visual disturbance.  Gastrointestinal:  Negative for abdominal pain, anal bleeding and rectal pain.  Genitourinary:  Negative for dysuria, genital sores, penile discharge, penile pain, scrotal swelling and testicular pain.  Musculoskeletal:  Negative for arthralgias and joint swelling.  Skin:  Negative for rash.  Neurological:  Negative for headaches.  Hematological:  Negative for adenopathy.     Past Medical History:  Diagnosis Date   HIV disease (HCC)     Outpatient Medications Prior to Visit  Medication Sig Dispense Refill   phentermine (ADIPEX-P) 37.5 MG tablet Take 37.5 mg by mouth every morning.     elvitegravir-cobicistat-emtricitabine-tenofovir (GENVOYA) 150-150-200-10 MG TABS tablet Take 1 tablet by mouth daily with food 30 tablet 0   hydrocortisone (PROCTOZONE-HC) 2.5 % rectal cream Place 1 application rectally 2 (two) times daily. (Patient not taking: Reported on 07/08/2022) 30 g 0   No facility-administered medications prior to  visit.    No Known Allergies  Family History  Problem Relation Age of Onset   Colon cancer Neg Hx    Esophageal cancer Neg Hx    Stomach cancer Neg Hx         Objective:  Objective  Vitals:   07/08/22 1459  BP: 136/86  Pulse: 70  Temp: 97.6 F (36.4 C)  SpO2: 97%   Body mass index is 30.9 kg/m.   Physical Exam  Physical Exam Vitals reviewed.  Constitutional:      Appearance: He is well-developed.  HENT:     Mouth/Throat:     Dentition: Normal dentition. No dental abscesses.  Cardiovascular:     Rate and Rhythm: Normal rate and regular rhythm.     Heart sounds: Normal heart sounds.  Pulmonary:     Effort: Pulmonary effort is normal.     Breath sounds: Normal breath sounds.  Abdominal:     General: There is no distension.     Palpations: Abdomen is soft.     Tenderness: There is no abdominal tenderness.  Lymphadenopathy:     Cervical: No cervical adenopathy.  Skin:    General: Skin is warm and dry.     Findings: No rash.  Neurological:     Mental Status: He is alert and oriented to person, place, and time.  Psychiatric:        Judgment: Judgment normal.          Assessment &  Plan:    Problem List Items Addressed This Visit       Unprioritized   HIV (human immunodeficiency virus infection) (HCC) (Chronic)    VL has been 3,000 >> 15,000 last 2 checks for 7 months. In further discussion about lab results from January he confided he is having a hard time with adherence. Very interested in Guinea injections. We explained today that this is best done as a stable switch and likely will not be approved by insurance unless that's the case. If we can get his VL < 50 I think we can proceed with the switch. He was educated on frequency of appointments, TWO injections every 2 months and viral load monitoring/testing during this transition.   Will have him back in 10m to see Dr. Drue Second to re-discuss. Given intermittent Genvoya use will repeat Genotype to  ensure he is still a candidate for this treatment.   Sexual health and family planning needs discussed - no needs today.       Relevant Medications   elvitegravir-cobicistat-emtricitabine-tenofovir (GENVOYA) 150-150-200-10 MG TABS tablet   Other Visit Diagnoses     Human immunodeficiency virus I infection (HCC)       Relevant Medications   elvitegravir-cobicistat-emtricitabine-tenofovir (GENVOYA) 150-150-200-10 MG TABS tablet   Other Relevant Orders   HIV RNA, RTPCR W/R GT (RTI, PI,INT)       Rexene Alberts, MSN, NP-C [facility] Office: [phone] Pager: [phone]

## 2022-07-12 LAB — HIV RNA, RTPCR W/R GT (RTI, PI,INT)
HIV 1 RNA Quant: NOT DETECTED copies/mL
HIV-1 RNA Quant, Log: NOT DETECTED Log copies/mL

## 2022-07-16 ENCOUNTER — Other Ambulatory Visit (HOSPITAL_COMMUNITY): Payer: Self-pay

## 2022-07-16 ENCOUNTER — Ambulatory Visit (INDEPENDENT_AMBULATORY_CARE_PROVIDER_SITE_OTHER): Payer: Medicare HMO | Admitting: Internal Medicine

## 2022-07-16 ENCOUNTER — Other Ambulatory Visit: Payer: Self-pay

## 2022-07-16 ENCOUNTER — Encounter: Payer: Self-pay | Admitting: Internal Medicine

## 2022-07-16 ENCOUNTER — Telehealth: Payer: Self-pay

## 2022-07-16 VITALS — BP 154/89 | HR 75 | Temp 98.3°F | Ht 64.0 in | Wt 181.0 lb

## 2022-07-16 DIAGNOSIS — Z79899 Other long term (current) drug therapy: Secondary | ICD-10-CM | POA: Diagnosis not present

## 2022-07-16 DIAGNOSIS — R030 Elevated blood-pressure reading, without diagnosis of hypertension: Secondary | ICD-10-CM | POA: Diagnosis not present

## 2022-07-16 DIAGNOSIS — B20 Human immunodeficiency virus [HIV] disease: Secondary | ICD-10-CM

## 2022-07-16 MED ORDER — CABOTEGRAVIR & RILPIVIRINE ER 600 & 900 MG/3ML IM SUER
1.0000 | INTRAMUSCULAR | 1 refills | Status: DC
  Filled 2022-07-16 – 2022-07-17 (×2): qty 6, 30d supply, fill #0
  Filled 2022-08-09: qty 6, 30d supply, fill #1

## 2022-07-16 MED ORDER — CABOTEGRAVIR & RILPIVIRINE ER 600 & 900 MG/3ML IM SUER
1.0000 | INTRAMUSCULAR | 5 refills | Status: DC
  Filled 2022-07-16 – 2022-10-21 (×2): qty 6, 60d supply, fill #0
  Filled 2022-12-12: qty 6, 60d supply, fill #1
  Filled 2023-02-07: qty 6, 60d supply, fill #2
  Filled 2023-04-03: qty 6, 60d supply, fill #3
  Filled 2023-06-05: qty 6, 60d supply, fill #4

## 2022-07-16 NOTE — Progress Notes (Signed)
Patient ID: Matthew Schroeder, male   DOB: [birth year], 26 y.o.   MRN: [id number]  HPI 26yo M with HIV disease, CD 4 count of  Interested in doing cabenuva since difficulty with taking meds daily but he undetectable.  Otherwise in good health Finished recently 4 yr relationship. Not dating anyone new Outpatient Encounter Medications as of 07/16/2022  Medication Sig   elvitegravir-cobicistat-emtricitabine-tenofovir (GENVOYA) 150-150-200-10 MG TABS tablet Take 1 tablet by mouth daily with food   phentermine (ADIPEX-P) 37.5 MG tablet Take 37.5 mg by mouth every morning.   hydrocortisone (PROCTOZONE-HC) 2.5 % rectal cream Place 1 application rectally 2 (two) times daily. (Patient not taking: Reported on 07/08/2022)   No facility-administered encounter medications on file as of 07/16/2022.     Patient Active Problem List   Diagnosis Date Noted   Healthcare maintenance 11/20/2018   Mixed bipolar I disorder (HCC) 07/16/2016   Attention deficit hyperactivity disorder (ADHD) 07/16/2016   HIV (human immunodeficiency virus infection) (HCC) 06/08/2016     Health Maintenance Due  Topic Date Due   COVID-19 Vaccine (1) Never done   Hepatitis C Screening  Never done   INFLUENZA VACCINE  06/25/2022     Review of Systems Review of Systems  Constitutional: Negative for fever, chills, diaphoresis, activity change, appetite change, fatigue and unexpected weight change.  HENT: Negative for congestion, sore throat, rhinorrhea, sneezing, trouble swallowing and sinus pressure.  Eyes: Negative for photophobia and visual disturbance.  Respiratory: Negative for cough, chest tightness, shortness of breath, wheezing and stridor.  Cardiovascular: Negative for chest pain, palpitations and leg swelling.  Gastrointestinal: Negative for nausea, vomiting, abdominal pain, diarrhea, constipation, blood in stool, abdominal distention and anal bleeding.  Genitourinary: Negative for dysuria, hematuria, flank pain  and difficulty urinating.  Musculoskeletal: Negative for myalgias, back pain, joint swelling, arthralgias and gait problem.  Skin: Negative for color change, pallor, rash and wound.  Neurological: Negative for dizziness, tremors, weakness and light-headedness.  Hematological: Negative for adenopathy. Does not bruise/bleed easily.  Psychiatric/Behavioral: Negative for behavioral problems, confusion, sleep disturbance, dysphoric mood, decreased concentration and agitation.   Physical Exam   BP (!) 154/89   Pulse 75   Temp 98.3 F (36.8 C) (Oral)   Ht 5\' 4"  (1.626 m)   Wt 181 lb (82.1 kg)   BMI 31.07 kg/m   Physical Exam  Constitutional: He is oriented to person, place, and time. He appears well-developed and well-nourished. No distress.  HENT:  Mouth/Throat: Oropharynx is clear and moist. No oropharyngeal exudate.  Cardiovascular: Normal rate, regular rhythm and normal heart sounds. Exam reveals no gallop and no friction rub.  No murmur heard.  Pulmonary/Chest: Effort normal and breath sounds normal. No respiratory distress. He has no wheezes.  Abdominal: Soft. Bowel sounds are normal. He exhibits no distension. There is no tenderness.  Lymphadenopathy:  He has no cervical adenopathy.  Neurological: He is alert and oriented to person, place, and time.  Skin: Skin is warm and dry. No rash noted. No erythema.  Psychiatric: He has a normal mood and affect. His behavior is normal.   Lab Results  Component Value Date   CD4TCELL 41 11/13/2021   Lab Results  Component Value Date   CD4TABS 724 11/13/2021   CD4TABS 1,278 03/21/2021   CD4TABS 984 09/12/2020   Lab Results  Component Value Date   HIV1RNAQUANT NOT DETECTED 07/08/2022   No results found for: "HEPBSAB" Lab Results  Component Value Date   LABRPR REACTIVE (A) 11/13/2021  CBC Lab Results  Component Value Date   WBC 7.3 11/18/2021   RBC 5.66 11/18/2021   HGB 16.2 11/18/2021   HCT 48.6 11/18/2021   PLT 361  11/18/2021   MCV 85.9 11/18/2021   MCH 28.6 11/18/2021   MCHC 33.3 11/18/2021   RDW 14.0 11/18/2021   LYMPHSABS 1,805 11/13/2021   MONOABS 0.7 05/05/2018   EOSABS 82 11/13/2021    BMET Lab Results  Component Value Date   NA 136 11/18/2021   K 3.5 11/18/2021   CL 103 11/18/2021   CO2 22 11/18/2021   GLUCOSE 103 (H) 11/18/2021   BUN 14 11/18/2021   CREATININE 1.15 11/18/2021   CALCIUM 9.3 11/18/2021   GFRNONAA >60 11/18/2021   GFRAA 106 03/21/2021      Assessment and Plan   HIV disease= will refer to pharmacy to see if patient is candidate for cabenuva. Will need to get labs done today  Long term medication management= will check cr to ensure at baseline  Health maintenance = will need to do sti testing  Pre-hypertension = will need to see if persists and if need for htn medication at next visit  See back in 3 mo (or 2 mo if starts cabenuva)

## 2022-07-16 NOTE — Telephone Encounter (Addendum)
HPI: Matthew Schroeder is a 26 y.o. male who presents to the [facility] pharmacy clinic for [place] counseling.  Patient Active Problem List   Diagnosis Date Noted   Healthcare maintenance 11/20/2018   Mixed bipolar I disorder (HCC) 07/16/2016   Attention deficit hyperactivity disorder (ADHD) 07/16/2016   HIV (human immunodeficiency virus infection) (HCC) 06/08/2016    @EDPTMEDS @  Allergies: No Known Allergies  Past Medical History: Past Medical History:  Diagnosis Date   HIV disease (HCC)     Social History: Social History   Socioeconomic History   Marital status: Single    Spouse name: Not on file   Number of children: 0   Years of education: Not on file   Highest education level: Not on file  Occupational History   Occupation: Phlebotomist  Tobacco Use   Smoking status: Never   Smokeless tobacco: Never  Vaping Use   Vaping Use: Never used  Substance and Sexual Activity   Alcohol use: No   Drug use: No   Sexual activity: Not Currently    Partners: Male    Birth control/protection: Condom    Comment: declined condoms  Other Topics Concern   Not on file  Social History Narrative   ** Merged History Encounter **       Social Determinants of Health   Financial Resource Strain: Not on file  Food Insecurity: Not on file  Transportation Needs: Not on file  Physical Activity: Not on file  Stress: Not on file  Social Connections: Not on file    Labs: Lab Results  Component Value Date   HIV1RNAQUANT NOT DETECTED 07/08/2022   HIV1RNAQUANT 15,200 (H) 11/27/2021   HIV1RNAQUANT 3,930 (H) 11/13/2021   CD4TABS 724 11/13/2021   CD4TABS 1,278 03/21/2021   CD4TABS 984 09/12/2020    RPR and STI Lab Results  Component Value Date   LABRPR REACTIVE (A) 11/13/2021   LABRPR REACTIVE (A) 03/21/2021   LABRPR REACTIVE (A) 09/12/2020   LABRPR REACTIVE (A) 05/10/2020   LABRPR NON-REACTIVE 06/10/2019   RPRTITER 1:1 (H) 11/13/2021   RPRTITER 1:1 (H) 03/21/2021    RPRTITER 1:2 (H) 09/12/2020   RPRTITER 1:4 (H) 05/10/2020   RPRTITER 1:2 (H) 11/20/2018    STI Results GC CT  09/12/2020  3:28 PM Negative  Negative   06/10/2019 12:00 AM Negative  Negative   05/05/2018 12:00 AM **POSITIVE**  Negative   09/02/2017 12:00 AM Negative  Negative     Hepatitis B No results found for: "HEPBSAB", "HEPBSAG", "HEPBCAB" Hepatitis C No results found for: "HEPCAB", "HCVRNAPCRQN" Hepatitis A No results found for: "HAV" Lipids: Lab Results  Component Value Date   CHOL 206 (H) 03/21/2021   TRIG 153 (H) 03/21/2021   HDL 47 03/21/2021   CHOLHDL 4.4 03/21/2021   LDLCALC 132 (H) 03/21/2021    Current HIV Regimen: Genvoya   Assessment: Matthew Schroeder presents today for B20 follow-up with Dr. Erick Blinks. Patient was curious about Cabenuva as he has had some issues with adherence, but recent recent assessment of VL on 8/14 was undetectable. Patient states his schedule is most likely going to be more consistent moving forward and believes 9/14 will help him with adherence.   Counseled that Guinea is two separate intramuscular injections in the gluteal muscle on each side for each visit. Explained that the second injection is 30 days after the initial injection then every 2 months thereafter. Discussed the rare but significant chance of developing resistance despite compliance. Explained that showing up to  injection appointments is very important and warned that if 2 appointments are missed, it will be reassessed by their provider whether they are a good candidate for injection therapy. Counseled on possible side effects associated with the injections such as injection site pain, which is usually mild to moderate in nature, injection site nodules, and injection site reactions.   All of Matthew Schroeder questions were answered, and he was informed we would ask Lupita Leash to check insurance coverage and we would reach out to him to schedule his first Cabenuva injection.     Plan: - F/U insurance coverage of Cabenuva and scheduling of first injection  - Call with any questions or concerns   Jani Gravel, PharmD PGY-2 Infectious Diseases Resident  07/16/2022 3:48 PM

## 2022-07-16 NOTE — Telephone Encounter (Signed)
[facility] Patient Advocate Encounter  Matthew Schroeder is covered under the patient pharmacy benefits.   Prescription can be filled at [facility].  Clearance Coots, CPhT Specialty Pharmacy Patient [facility] for Infectious Disease Phone: [phone] Fax:  [phone]

## 2022-07-17 ENCOUNTER — Other Ambulatory Visit (HOSPITAL_COMMUNITY): Payer: Self-pay

## 2022-07-18 ENCOUNTER — Telehealth: Payer: Self-pay

## 2022-07-18 NOTE — Telephone Encounter (Signed)
[facility] Patient Advocate Encounter  Patient's medication (Cabenuva) have been couriered to [facility] from [facility] and will be administered on the patient next office visit on 07/23/22.  Kaitlin Ardito , CPhT Specialty Pharmacy Patient [facility] Phone: [phone] Fax:  [phone]  

## 2022-07-23 ENCOUNTER — Other Ambulatory Visit: Payer: Self-pay

## 2022-07-23 ENCOUNTER — Ambulatory Visit (INDEPENDENT_AMBULATORY_CARE_PROVIDER_SITE_OTHER): Payer: Medicare HMO | Admitting: Pharmacist

## 2022-07-23 DIAGNOSIS — Z23 Encounter for immunization: Secondary | ICD-10-CM | POA: Diagnosis not present

## 2022-07-23 DIAGNOSIS — B20 Human immunodeficiency virus [HIV] disease: Secondary | ICD-10-CM

## 2022-07-23 MED ORDER — CABOTEGRAVIR & RILPIVIRINE ER 600 & 900 MG/3ML IM SUER
1.0000 | Freq: Once | INTRAMUSCULAR | Status: AC
  Administered 2022-07-23: 1 via INTRAMUSCULAR

## 2022-07-23 NOTE — Progress Notes (Unsigned)
HPI: Matthew Schroeder is a 26 y.o. male who presents to the [facility]roeder clinic for Matthew Schroeder administration.  Patient Active Problem List   Diagnosis Date Noted   Healthcare maintenance 11/20/2018   Mixed bipolar I disorder (Matthew Schroeder) 07/16/2016   Attention deficit hyperactivity disorder (ADHD) 07/16/2016   Matthew (human immunodeficiency virus infection) ([place]) 06/08/2016    Patient's Medications  New Prescriptions   No medications on file  Previous Medications   CABOTEGRAVIR & RILPIVIRINE ER (CABENUVA) 600 & 900 MG/3ML INJECTION    Inject 1 kit into the muscle every 30 (thirty) days.   CABOTEGRAVIR & RILPIVIRINE ER (CABENUVA) 600 & 900 MG/3ML INJECTION    Inject 1 kit into the muscle every 2 (two) months.   ELVITEGRAVIR-COBICISTAT-EMTRICITABINE-TENOFOVIR (GENVOYA) 150-150-200-10 MG TABS TABLET    Take 1 tablet by mouth daily with food   HYDROCORTISONE (PROCTOZONE-HC) 2.5 % RECTAL CREAM    Place 1 application rectally 2 (two) times daily.   PHENTERMINE (ADIPEX-P) 37.5 MG TABLET    Take 37.5 mg by mouth every morning.  Modified Medications   No medications on file  Discontinued Medications   No medications on file    Allergies: No Known Allergies  Past Medical History: Past Medical History:  Diagnosis Date   Matthew disease ([place])     Social History: Social History   Socioeconomic History   Marital status: Single    Spouse name: Not on file   Number of children: 0   Years of education: Not on file   Highest education level: Not on file  Occupational History   Occupation: Phlebotomist  Tobacco Use   Smoking status: Never   Smokeless tobacco: Never  Vaping Use   Vaping Use: Never used  Substance and Sexual Activity   Alcohol use: No   Drug use: No   Sexual activity: Not Currently    Partners: Male    Birth control/protection: Condom    Comment: declined condoms  Other Topics Concern   Not on file  Social History Narrative   ** Merged History Encounter **        Social Determinants of Health   Financial Resource Strain: Not on file  Food Insecurity: Not on file  Transportation Needs: Not on file  Physical Activity: Not on file  Stress: Not on file  Social Connections: Not on file    Labs: Lab Results  Component Value Date   HIV1RNAQUANT NOT DETECTED 07/08/2022   HIV1RNAQUANT 15,200 (H) 11/27/2021   HIV1RNAQUANT 3,930 (H) 11/13/2021   CD4TABS 724 11/13/2021   CD4TABS 1,278 03/21/2021   CD4TABS 984 09/12/2020    RPR and STI Lab Results  Component Value Date   LABRPR REACTIVE (A) 11/13/2021   LABRPR REACTIVE (A) 03/21/2021   LABRPR REACTIVE (A) 09/12/2020   LABRPR REACTIVE (A) 05/10/2020   LABRPR NON-REACTIVE 06/10/2019   RPRTITER 1:1 (H) 11/13/2021   RPRTITER 1:1 (H) 03/21/2021   RPRTITER 1:2 (H) 09/12/2020   RPRTITER 1:4 (H) 05/10/2020   RPRTITER 1:2 (H) 11/20/2018    STI Results GC CT  09/12/2020  3:28 PM Negative  Negative   06/10/2019 12:00 AM Negative  Negative   05/05/2018 12:00 AM **POSITIVE**  Negative   09/02/2017 12:00 AM Negative  Negative     Hepatitis B No results found for: "HEPBSAB", "HEPBSAG", "HEPBCAB" Hepatitis C No results found for: "HEPCAB", "HCVRNAPCRQN" Hepatitis A No results found for: "HAV" Lipids: Lab Results  Component Value Date   CHOL 206 (H) 03/21/2021  TRIG 153 (H) 03/21/2021   HDL 47 03/21/2021   CHOLHDL 4.4 03/21/2021   LDLCALC 132 (H) 03/21/2021    Current Matthew Regimen: Genvoya   TARGET DATE: The 29th of each month   Assessment: Matthew Schroeder presents today for his first initiation injection for Cabenuva. Counseled that [place] is two separate intramuscular injections in the gluteal muscle on each side for each visit. Explained that the second injection is 30 days after the initial injection then every 2 months thereafter. Discussed the rare but significant chance of developing resistance despite compliance. Explained that showing up to injection appointments is very  important and warned that if 2 appointments are missed, it will be reassessed by their provider whether they are a good candidate for injection therapy. Counseled on possible side effects associated with the injections such as injection site pain, which is usually mild to moderate in nature, injection site nodules, and injection site reactions. Asked to call the clinic or send me a mychart message if they experience any issues, such as fatigue, nausea, headache, rash, or dizziness. Advised that they can take ibuprofen or tylenol for injection site pain if needed.   Administered cabotegravir 647m/3mL in left upper outer quadrant of the gluteal muscle. Administered rilpivirine 900 mg/32min the right upper outer quadrant of the gluteal muscle. Monitored patient for 10 minutes after injection. Injections were tolerated well without issue. Counseled to stop taking Genvoya after today's dose and to call with any issues that may arise. Will make follow up appointments for second initiation injection in 30 days and then maintenance injections every 2 months thereafter.   Pt politely declined any additional STD screenings during today's visits. Condoms offered and politely declined. Patient was previously immunized for Hepatitis A (last dose 2007) and Hepatitis B (last dose 1999), but has not been screened for immunity since that time. Patient voiced consent to obtain testing to confirm immunity against these viruses and informed we would wish to proceed with immunization should these tests be non-reactive. He voiced his consent and understanding.   Plan: - Stop Genvoya after today's dose - F/U Hep A/B/C antibodies  - Tdap, Meningococcal boosters due and administered  - First Cabenuva injections administered - Second initiation injection scheduled for 08/26/2022 - Call with any issues or questions  AuAdria DillPharmD PGY-2 Infectious Diseases Resident  07/23/2022 3:16 PM

## 2022-07-24 LAB — HEPATITIS A ANTIBODY, TOTAL: Hepatitis A AB,Total: REACTIVE — AB

## 2022-07-24 LAB — RPR: RPR Ser Ql: NONREACTIVE

## 2022-07-24 LAB — HEPATITIS B SURFACE ANTIBODY,QUALITATIVE: Hep B S Ab: REACTIVE — AB

## 2022-07-24 LAB — HEPATITIS B SURFACE ANTIGEN: Hepatitis B Surface Ag: NONREACTIVE

## 2022-07-24 LAB — HEPATITIS C ANTIBODY: Hepatitis C Ab: NONREACTIVE

## 2022-07-31 ENCOUNTER — Other Ambulatory Visit: Payer: Self-pay

## 2022-07-31 ENCOUNTER — Emergency Department (HOSPITAL_COMMUNITY)
Admission: EM | Admit: 2022-07-31 | Discharge: 2022-07-31 | Disposition: A | Discharge status: Home / Self Care | Payer: Medicare HMO | Attending: Emergency Medicine | Admitting: Emergency Medicine

## 2022-07-31 ENCOUNTER — Emergency Department (HOSPITAL_COMMUNITY): Payer: Medicare HMO

## 2022-07-31 DIAGNOSIS — Z21 Asymptomatic human immunodeficiency virus [HIV] infection status: Secondary | ICD-10-CM | POA: Insufficient documentation

## 2022-07-31 DIAGNOSIS — S61411A Laceration without foreign body of right hand, initial encounter: Secondary | ICD-10-CM | POA: Diagnosis not present

## 2022-07-31 DIAGNOSIS — M7989 Other specified soft tissue disorders: Secondary | ICD-10-CM | POA: Insufficient documentation

## 2022-07-31 DIAGNOSIS — Z23 Encounter for immunization: Secondary | ICD-10-CM | POA: Diagnosis not present

## 2022-07-31 DIAGNOSIS — Y92410 Unspecified street and highway as the place of occurrence of the external cause: Secondary | ICD-10-CM | POA: Insufficient documentation

## 2022-07-31 DIAGNOSIS — S6991XA Unspecified injury of right wrist, hand and finger(s), initial encounter: Secondary | ICD-10-CM | POA: Diagnosis present

## 2022-07-31 MED ORDER — TETANUS-DIPHTH-ACELL PERTUSSIS 5-2.5-18.5 LF-MCG/0.5 IM SUSY
PREFILLED_SYRINGE | INTRAMUSCULAR | Status: AC
  Filled 2022-07-31: qty 0.5

## 2022-07-31 MED ORDER — TETANUS-DIPHTH-ACELL PERTUSSIS 5-2.5-18.5 LF-MCG/0.5 IM SUSY
0.5000 mL | PREFILLED_SYRINGE | Freq: Once | INTRAMUSCULAR | Status: AC
Start: 2022-07-31 — End: 2022-07-31
  Administered 2022-07-31: 0.5 mL via INTRAMUSCULAR
  Filled 2022-07-31: qty 0.5

## 2022-07-31 MED ORDER — HYDROCODONE-ACETAMINOPHEN 5-325 MG PO TABS: 1.0000 | ORAL_TABLET | ORAL | 0 refills | Status: AC | PRN

## 2022-07-31 MED ORDER — LIDOCAINE HCL (PF) 1 % IJ SOLN
20.0000 mL | Freq: Once | INTRAMUSCULAR | Status: AC
  Administered 2022-07-31: 15 mL
  Filled 2022-07-31: qty 20
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
## 2022-07-31 NOTE — ED Provider Notes (Signed)
LACERATION REPAIR Performed by: Langston Masker Authorized by: Langston Masker Consent: Verbal consent obtained. Risks and benefits: risks, benefits and alternatives were discussed Consent given by: patient Patient identity confirmed: provided demographic data Prepped and Draped in normal sterile fashion Wound explored  Laceration Location: right pal   Laceration Length: 6cm  No Foreign Bodies seen or palpated  Anesthesia: local infiltration  Local anesthetic: lidocaine 1 %  Anesthetic total: 10 ml  Irrigation method: syringe Amount of cleaning: standard  Skin closure: sutures  Number of sutures: 10  Technique: simple  Wound irrigated copiously,  sound explored  flexor tendon sheath laceration,  Pt has full range of motion, nv and ns intact.    I discussed the pt with Dr. Frazier Butt  who will see in the office next week.  He advised bulky dressing.     Patient tolerance: Patient tolerated the procedure well with no immediate complications.     Osie Cheeks 07/31/22 2103    Rondel Baton, MD 08/01/22 1054
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
## 2022-08-09 ENCOUNTER — Other Ambulatory Visit (HOSPITAL_COMMUNITY): Payer: Self-pay

## 2022-08-12 ENCOUNTER — Telehealth: Payer: Self-pay

## 2022-08-12 NOTE — Telephone Encounter (Signed)
[facility] Patient Advocate Encounter  Patient's medication Kern Reap) have been couriered to [facility] from [facility] and will be administered on the patient next office visit on 08/26/22.  Ileene Patrick , [facility] for Infectious Disease Phone: [phone] Fax:  [phone]

## 2022-08-26 ENCOUNTER — Ambulatory Visit (INDEPENDENT_AMBULATORY_CARE_PROVIDER_SITE_OTHER): Payer: Medicare HMO | Admitting: Pharmacist

## 2022-08-26 ENCOUNTER — Ambulatory Visit (HOSPITAL_COMMUNITY)
Admission: EM | Admit: 2022-08-26 | Discharge: 2022-08-26 | Disposition: A | Discharge status: Home / Self Care | Payer: Medicare HMO

## 2022-08-26 ENCOUNTER — Other Ambulatory Visit: Payer: Self-pay

## 2022-08-26 DIAGNOSIS — B20 Human immunodeficiency virus [HIV] disease: Secondary | ICD-10-CM

## 2022-08-26 DIAGNOSIS — Z23 Encounter for immunization: Secondary | ICD-10-CM | POA: Diagnosis not present

## 2022-08-26 MED ORDER — CABOTEGRAVIR & RILPIVIRINE ER 600 & 900 MG/3ML IM SUER
1.0000 | Freq: Once | INTRAMUSCULAR | Status: AC
  Administered 2022-08-26: 1 via INTRAMUSCULAR

## 2022-08-26 NOTE — Progress Notes (Signed)
HPI: Matthew Schroeder is a 26 y.o. male who presents to the [place] clinic for [place] administration.  Patient Active Problem List   Diagnosis Date Noted   Healthcare maintenance 11/20/2018   Mixed bipolar I disorder ([place]) 07/16/2016   Attention deficit hyperactivity disorder (ADHD) 07/16/2016   HIV (human immunodeficiency virus infection) ([place]) 06/08/2016    Patient's Medications  New Prescriptions   No medications on file  Previous Medications   CABOTEGRAVIR & RILPIVIRINE ER (CABENUVA) 600 & 900 MG/3ML INJECTION    Inject 1 kit into the muscle every 30 (thirty) days.   CABOTEGRAVIR & RILPIVIRINE ER (CABENUVA) 600 & 900 MG/3ML INJECTION    Inject 1 kit into the muscle every 2 (two) months.   ELVITEGRAVIR-COBICISTAT-EMTRICITABINE-TENOFOVIR (GENVOYA) 150-150-200-10 MG TABS TABLET    Take 1 tablet by mouth daily with food   HYDROCODONE-ACETAMINOPHEN (NORCO/VICODIN) 5-325 MG TABLET    Take 1 tablet by mouth every 4 (four) hours as needed for moderate pain.   HYDROCORTISONE (PROCTOZONE-HC) 2.5 % RECTAL CREAM    Place 1 application rectally 2 (two) times daily.   PHENTERMINE (ADIPEX-P) 37.5 MG TABLET    Take 37.5 mg by mouth every morning.  Modified Medications   No medications on file  Discontinued Medications   No medications on file    Allergies: No Known Allergies  Past Medical History: Past Medical History:  Diagnosis Date   HIV disease ([place])     Social History: Social History   Socioeconomic History   Marital status: Single    Spouse name: Not on file   Number of children: 0   Years of education: Not on file   Highest education level: Not on file  Occupational History   Occupation: Phlebotomist  Tobacco Use   Smoking status: Never   Smokeless tobacco: Never  Vaping Use   Vaping Use: Never used  Substance and Sexual Activity   Alcohol use: No   Drug use: No   Sexual activity: Not Currently    Partners: Male    Birth control/protection: Condom     Comment: declined condoms  Other Topics Concern   Not on file  Social History Narrative   ** Merged History Encounter **       Social Determinants of Health   Financial Resource Strain: Not on file  Food Insecurity: Not on file  Transportation Needs: Not on file  Physical Activity: Not on file  Stress: Not on file  Social Connections: Not on file    Labs: Lab Results  Component Value Date   HIV1RNAQUANT NOT DETECTED 07/08/2022   HIV1RNAQUANT 15,200 (H) 11/27/2021   HIV1RNAQUANT 3,930 (H) 11/13/2021   CD4TABS 724 11/13/2021   CD4TABS 1,278 03/21/2021   CD4TABS 984 09/12/2020    RPR and STI Lab Results  Component Value Date   LABRPR NON-REACTIVE 07/23/2022   LABRPR REACTIVE (A) 11/13/2021   LABRPR REACTIVE (A) 03/21/2021   LABRPR REACTIVE (A) 09/12/2020   LABRPR REACTIVE (A) 05/10/2020   RPRTITER 1:1 (H) 11/13/2021   RPRTITER 1:1 (H) 03/21/2021   RPRTITER 1:2 (H) 09/12/2020   RPRTITER 1:4 (H) 05/10/2020   RPRTITER 1:2 (H) 11/20/2018    STI Results GC CT  09/12/2020  3:28 PM Negative  Negative   06/10/2019 12:00 AM Negative  Negative   05/05/2018 12:00 AM **POSITIVE**  Negative   09/02/2017 12:00 AM Negative  Negative     Hepatitis B Lab Results  Component Value Date   HEPBSAB REACTIVE (A) 07/23/2022  HEPBSAG NON-REACTIVE 07/23/2022   Hepatitis C Lab Results  Component Value Date   HEPCAB NON-REACTIVE 07/23/2022   Hepatitis A Lab Results  Component Value Date   HAV REACTIVE (A) 07/23/2022   Lipids: Lab Results  Component Value Date   CHOL 206 (H) 03/21/2021   TRIG 153 (H) 03/21/2021   HDL 47 03/21/2021   CHOLHDL 4.4 03/21/2021   LDLCALC 132 (H) 03/21/2021    TARGET DATE: The 29th  Assessment: Matthew Schroeder presents today for his maintenance Cabenuva injections. Initial injections were tolerated well without issues. He had mild soreness for about 2 days.  Administered cabotegravir 674m/3mL in left upper outer quadrant of the gluteal muscle.  Administered rilpivirine 900 mg/333min the right upper outer quadrant of the gluteal muscle. No issues with injections. He will follow up in 2 months for next set of injections. He is requesting to keep his appointment with Matthew Schroeder the end of the month.   Plan: - Cabenuva injections administered - HIV RNA today - Flu shot today - F/u with Dr. SnBaxter Flattery0/24 at 330pm - Next injections scheduled for 10/28/22 with me - Call with any issues or questions  Matthew Schroeder, PharmD, BCIDP, AAHIVP, CPForest Acreslinical Pharmacist Practitioner [facility] Infectious Disease

## 2022-08-26 NOTE — ED Triage Notes (Signed)
13 sutures removed from RT palm

## 2022-08-26 NOTE — ED Triage Notes (Signed)
PT presents for suture removal from Rt palm.

## 2022-08-28 LAB — HIV-1 RNA QUANT-NO REFLEX-BLD
HIV 1 RNA Quant: NOT DETECTED Copies/mL
HIV-1 RNA Quant, Log: NOT DETECTED Log cps/mL

## 2022-09-16 ENCOUNTER — Encounter: Payer: Self-pay | Admitting: Pharmacist

## 2022-09-17 ENCOUNTER — Ambulatory Visit: Payer: Medicare HMO | Admitting: Internal Medicine

## 2022-10-01 ENCOUNTER — Other Ambulatory Visit (HOSPITAL_COMMUNITY): Payer: Self-pay

## 2022-10-21 ENCOUNTER — Other Ambulatory Visit (HOSPITAL_COMMUNITY): Payer: Self-pay

## 2022-10-22 ENCOUNTER — Telehealth: Payer: Self-pay

## 2022-10-22 ENCOUNTER — Other Ambulatory Visit (HOSPITAL_COMMUNITY): Payer: Self-pay

## 2022-10-22 NOTE — Telephone Encounter (Signed)
[facility] Patient Advocate Encounter  Patient's medication Matthew Schroeder) have been couriered to [facility] from [employer] and will be administered on the patient next office visit on 10/29/22.  Clearance Coots , CPhT [facility] for Infectious Disease Phone: [phone] Fax:  [phone]

## 2022-10-28 ENCOUNTER — Encounter: Payer: Medicare HMO | Admitting: Pharmacist

## 2022-10-29 ENCOUNTER — Other Ambulatory Visit: Payer: Self-pay

## 2022-10-29 ENCOUNTER — Ambulatory Visit (INDEPENDENT_AMBULATORY_CARE_PROVIDER_SITE_OTHER): Payer: Medicare HMO

## 2022-10-29 ENCOUNTER — Other Ambulatory Visit (HOSPITAL_COMMUNITY)
Admission: RE | Admit: 2022-10-29 | Discharge: 2022-10-29 | Disposition: A | Discharge status: Home / Self Care | Payer: Medicare HMO | Source: Ambulatory Visit | Attending: Internal Medicine | Admitting: Internal Medicine

## 2022-10-29 ENCOUNTER — Ambulatory Visit (INDEPENDENT_AMBULATORY_CARE_PROVIDER_SITE_OTHER): Payer: Medicare HMO | Admitting: Pharmacist

## 2022-10-29 DIAGNOSIS — Z23 Encounter for immunization: Secondary | ICD-10-CM | POA: Diagnosis not present

## 2022-10-29 DIAGNOSIS — Z113 Encounter for screening for infections with a predominantly sexual mode of transmission: Secondary | ICD-10-CM | POA: Diagnosis present

## 2022-10-29 DIAGNOSIS — B20 Human immunodeficiency virus [HIV] disease: Secondary | ICD-10-CM

## 2022-10-29 MED ORDER — CABOTEGRAVIR & RILPIVIRINE ER 600 & 900 MG/3ML IM SUER
1.0000 | Freq: Once | INTRAMUSCULAR | Status: AC
  Administered 2022-10-29: 1 via INTRAMUSCULAR

## 2022-10-29 NOTE — Progress Notes (Signed)
HPI: Matthew Schroeder is a 26 y.o. male who presents to the [facility] for [place] administration.  Patient Active Problem List   Diagnosis Date Noted   Healthcare maintenance 11/20/2018   Mixed bipolar I disorder ([place]) 07/16/2016   Attention deficit hyperactivity disorder (ADHD) 07/16/2016   HIV (human immunodeficiency virus infection) ([place]) 06/08/2016    Patient's Medications  New Prescriptions   No medications on file  Previous Medications   CABOTEGRAVIR & RILPIVIRINE ER (CABENUVA) 600 & 900 MG/3ML INJECTION    Inject 1 kit into the muscle every 30 (thirty) days.   CABOTEGRAVIR & RILPIVIRINE ER (CABENUVA) 600 & 900 MG/3ML INJECTION    Inject 1 kit into the muscle every 2 (two) months.   HYDROCODONE-ACETAMINOPHEN (NORCO/VICODIN) 5-325 MG TABLET    Take 1 tablet by mouth every 4 (four) hours as needed for moderate pain.   HYDROCORTISONE (PROCTOZONE-HC) 2.5 % RECTAL CREAM    Place 1 application rectally 2 (two) times daily.   PHENTERMINE (ADIPEX-P) 37.5 MG TABLET    Take 37.5 mg by mouth every morning.  Modified Medications   No medications on file  Discontinued Medications   No medications on file    Allergies: No Known Allergies  Past Medical History: Past Medical History:  Diagnosis Date   HIV disease ([place])     Social History: Social History   Socioeconomic History   Marital status: Single    Spouse name: Not on file   Number of children: 0   Years of education: Not on file   Highest education level: Not on file  Occupational History   Occupation: Phlebotomist  Tobacco Use   Smoking status: Never   Smokeless tobacco: Never  Vaping Use   Vaping Use: Never used  Substance and Sexual Activity   Alcohol use: No   Drug use: No   Sexual activity: Not Currently    Partners: Male    Birth control/protection: Condom    Comment: declined condoms  Other Topics Concern   Not on file  Social History Narrative   ** Merged History Encounter **        Social Determinants of Health   Financial Resource Strain: Not on file  Food Insecurity: Not on file  Transportation Needs: Not on file  Physical Activity: Not on file  Stress: Not on file  Social Connections: Not on file    Labs: Lab Results  Component Value Date   HIV1RNAQUANT Not Detected 08/26/2022   HIV1RNAQUANT NOT DETECTED 07/08/2022   HIV1RNAQUANT 15,200 (H) 11/27/2021   CD4TABS 724 11/13/2021   CD4TABS 1,278 03/21/2021   CD4TABS 984 09/12/2020    RPR and STI Lab Results  Component Value Date   LABRPR NON-REACTIVE 07/23/2022   LABRPR REACTIVE (A) 11/13/2021   LABRPR REACTIVE (A) 03/21/2021   LABRPR REACTIVE (A) 09/12/2020   LABRPR REACTIVE (A) 05/10/2020   RPRTITER 1:1 (H) 11/13/2021   RPRTITER 1:1 (H) 03/21/2021   RPRTITER 1:2 (H) 09/12/2020   RPRTITER 1:4 (H) 05/10/2020   RPRTITER 1:2 (H) 11/20/2018    STI Results GC CT  09/12/2020  3:28 PM Negative  Negative   06/10/2019 12:00 AM Negative  Negative   05/05/2018 12:00 AM **POSITIVE**  Negative   09/02/2017 12:00 AM Negative  Negative     Hepatitis B Lab Results  Component Value Date   HEPBSAB REACTIVE (A) 07/23/2022   HEPBSAG NON-REACTIVE 07/23/2022   Hepatitis C Lab Results  Component Value Date   HEPCAB NON-REACTIVE 07/23/2022  Hepatitis A Lab Results  Component Value Date   HAV REACTIVE (A) 07/23/2022   Lipids: Lab Results  Component Value Date   CHOL 206 (H) 03/21/2021   TRIG 153 (H) 03/21/2021   HDL 47 03/21/2021   CHOLHDL 4.4 03/21/2021   LDLCALC 132 (H) 03/21/2021    TARGET DATE: The 29th  Assessment: Matthew Schroeder presents today for his maintenance Cabenuva injections. Past injections were tolerated well without issues.  Administered cabotegravir 680m/3mL in left upper outer quadrant of the gluteal muscle. Administered rilpivirine 900 mg/36min the right upper outer quadrant of the gluteal muscle. No issues with injections. He will follow up in 2 months for next set of  injections.  Accepts STI testing today and also the MPX and COVID vaccines. Will schedule in ~28 days for 2nd MPX vaccine.  Plan: - Cabenuva injections administered - HIV RNA, RPR, and rectal/urine cytologies today - Next injections scheduled for 12/16/22 with Dr. SnBaxter Flatterynd 02/17/23 with me  - COVID vaccine today - MPX vaccine today - 2nd MPX vaccine scheduled for 11/26/22 with me - Call with any issues or questions  Matthew Schroeder L. Dhwani Venkatesh, PharmD, BCIDP, AAHIVP, CPTustinlinical Pharmacist Practitioner [facility] [facility]

## 2022-10-30 LAB — CYTOLOGY, (ORAL, ANAL, URETHRAL) ANCILLARY ONLY
Chlamydia: NEGATIVE
Comment: NEGATIVE
Comment: NORMAL
Neisseria Gonorrhea: NEGATIVE

## 2022-10-30 LAB — URINE CYTOLOGY ANCILLARY ONLY
Chlamydia: NEGATIVE
Comment: NEGATIVE
Comment: NORMAL
Neisseria Gonorrhea: NEGATIVE

## 2022-10-31 LAB — RPR: RPR Ser Ql: NONREACTIVE

## 2022-10-31 LAB — HIV-1 RNA QUANT-NO REFLEX-BLD
HIV 1 RNA Quant: NOT DETECTED Copies/mL
HIV-1 RNA Quant, Log: NOT DETECTED Log cps/mL

## 2022-11-26 ENCOUNTER — Ambulatory Visit: Payer: Medicare HMO | Admitting: Pharmacist

## 2022-11-27 ENCOUNTER — Other Ambulatory Visit: Payer: Self-pay

## 2022-11-27 ENCOUNTER — Ambulatory Visit (INDEPENDENT_AMBULATORY_CARE_PROVIDER_SITE_OTHER): Payer: Medicare HMO | Admitting: Pharmacist

## 2022-11-27 DIAGNOSIS — Z23 Encounter for immunization: Secondary | ICD-10-CM | POA: Diagnosis not present

## 2022-11-27 NOTE — Progress Notes (Signed)
   11/27/2022  HPI: Matthew Schroeder is a 27 y.o. male who presents to the [facility] clinic for monkeypox immunization.   [name] was observed post immunization for 15 minutes without incident. He was provided with Vaccine Information Sheet and instruction to access the V-Safe system.   [name] was instructed to call 911 with any severe reactions post vaccine: Difficulty breathing  Swelling of face and throat  A fast heartbeat  A bad rash all over body  Dizziness and weakness   Timo Hartwig L. Mcdaniel Ohms, PharmD, BCIDP, AAHIVP, CPP Clinical Pharmacist Practitioner Infectious Diseases [place] [facility] 11/27/2022, 2:49 PM

## 2022-12-12 ENCOUNTER — Other Ambulatory Visit (HOSPITAL_COMMUNITY): Payer: Self-pay

## 2022-12-13 ENCOUNTER — Other Ambulatory Visit (HOSPITAL_COMMUNITY): Payer: Self-pay

## 2022-12-16 ENCOUNTER — Ambulatory Visit (INDEPENDENT_AMBULATORY_CARE_PROVIDER_SITE_OTHER): Payer: Medicare HMO | Admitting: Internal Medicine

## 2022-12-16 ENCOUNTER — Other Ambulatory Visit (HOSPITAL_COMMUNITY): Payer: Self-pay

## 2022-12-16 ENCOUNTER — Other Ambulatory Visit: Payer: Self-pay

## 2022-12-16 ENCOUNTER — Encounter: Payer: Self-pay | Admitting: Internal Medicine

## 2022-12-16 VITALS — BP 154/103 | HR 74 | Temp 98.0°F | Resp 16 | Ht 64.0 in | Wt 191.0 lb

## 2022-12-16 DIAGNOSIS — R030 Elevated blood-pressure reading, without diagnosis of hypertension: Secondary | ICD-10-CM

## 2022-12-16 DIAGNOSIS — B20 Human immunodeficiency virus [HIV] disease: Secondary | ICD-10-CM

## 2022-12-16 MED ORDER — CABOTEGRAVIR & RILPIVIRINE ER 600 & 900 MG/3ML IM SUER
1.0000 | Freq: Once | INTRAMUSCULAR | Status: AC
  Administered 2022-12-16: 1 via INTRAMUSCULAR

## 2022-12-16 NOTE — Progress Notes (Signed)
RFV: follow up for hiv disease Patient ID: Matthew Schroeder, male   DOB: August 06, 1996, 27 y.o.   MRN: [id number]  HPI Matthew Schroeder is a 27yo M on cabenuva injection no issues the last time taking Not sexually active since last visit  Outpatient Encounter Medications as of 12/16/2022  Medication Sig   cabotegravir & rilpivirine ER (CABENUVA) 600 & 900 MG/3ML injection Inject 1 kit into the muscle every 30 (thirty) days.   cabotegravir & rilpivirine ER (CABENUVA) 600 & 900 MG/3ML injection Inject 1 kit into the muscle every 2 (two) months.   HYDROcodone-acetaminophen (NORCO/VICODIN) 5-325 MG tablet Take 1 tablet by mouth every 4 (four) hours as needed for moderate pain.   hydrocortisone (PROCTOZONE-HC) 2.5 % rectal cream Place 1 application rectally 2 (two) times daily. (Patient not taking: Reported on 07/08/2022)   phentermine (ADIPEX-P) 37.5 MG tablet Take 37.5 mg by mouth every morning.   No facility-administered encounter medications on file as of 12/16/2022.     Patient Active Problem List   Diagnosis Date Noted   Healthcare maintenance 11/20/2018   Mixed bipolar I disorder ([place]) 07/16/2016   Attention deficit hyperactivity disorder (ADHD) 07/16/2016   HIV (human immunodeficiency virus infection) ([place]) 06/08/2016     Health Maintenance Due  Topic Date Due   Medicare Annual Wellness (AWV)  Never done   COVID-19 Vaccine (2 - Pfizer risk series) 11/19/2022     Review of Systems Review of Systems  Constitutional: Negative for fever, chills, diaphoresis, activity change, appetite change, fatigue and unexpected weight change.  HENT: Negative for congestion, sore throat, rhinorrhea, sneezing, trouble swallowing and sinus pressure.  Eyes: Negative for photophobia and visual disturbance.  Respiratory: Negative for cough, chest tightness, shortness of breath, wheezing and stridor.  Cardiovascular: Negative for chest pain, palpitations and leg swelling.  Gastrointestinal: Negative for  nausea, vomiting, abdominal pain, diarrhea, constipation, blood in stool, abdominal distention and anal bleeding.  Genitourinary: Negative for dysuria, hematuria, flank pain and difficulty urinating.  Musculoskeletal: Negative for myalgias, back pain, joint swelling, arthralgias and gait problem.  Skin: Negative for color change, pallor, rash and wound.  Neurological: Negative for dizziness, tremors, weakness and light-headedness.  Hematological: Negative for adenopathy. Does not bruise/bleed easily.  Psychiatric/Behavioral: Negative for behavioral problems, confusion, sleep disturbance, dysphoric mood, decreased concentration and agitation.   Physical Exam   BP (!) 154/103   Pulse 74   Temp 98 F (36.7 C) (Oral)   Resp 16   Ht 5\' 4"  (1.626 m)   Wt 191 lb (86.6 kg)   SpO2 100%   BMI 32.79 kg/m   Physical Exam  Constitutional: He is oriented to person, place, and time. He appears well-developed and well-nourished. No distress.  HENT:  Mouth/Throat: Oropharynx is clear and moist. No oropharyngeal exudate.  Cardiovascular: Normal rate, regular rhythm and normal heart sounds. Exam reveals no gallop and no friction rub.  No murmur heard.  Pulmonary/Chest: Effort normal and breath sounds normal. No respiratory distress. He has no wheezes.  Abdominal: Soft. Bowel sounds are normal. He exhibits no distension. There is no tenderness.  Lymphadenopathy:  He has no cervical adenopathy.  Neurological: He is alert and oriented to person, place, and time.  Skin: Skin is warm and dry. No rash noted. No erythema.  Psychiatric: He has a normal mood and affect. His behavior is normal.   Lab Results  Component Value Date   CD4TCELL 41 11/13/2021   Lab Results  Component Value Date   CD4TABS 724  11/13/2021   CD4TABS 1,278 03/21/2021   CD4TABS 984 09/12/2020   Lab Results  Component Value Date   HIV1RNAQUANT Not Detected 10/29/2022   Lab Results  Component Value Date   HEPBSAB REACTIVE  (A) 07/23/2022   Lab Results  Component Value Date   LABRPR NON-REACTIVE 10/29/2022    CBC Lab Results  Component Value Date   WBC 7.3 11/18/2021   RBC 5.66 11/18/2021   HGB 16.2 11/18/2021   HCT 48.6 11/18/2021   PLT 361 11/18/2021   MCV 85.9 11/18/2021   MCH 28.6 11/18/2021   MCHC 33.3 11/18/2021   RDW 14.0 11/18/2021   LYMPHSABS 1,805 11/13/2021   MONOABS 0.7 05/05/2018   EOSABS 82 11/13/2021    BMET Lab Results  Component Value Date   NA 136 11/18/2021   K 3.5 11/18/2021   CL 103 11/18/2021   CO2 22 11/18/2021   GLUCOSE 103 (H) 11/18/2021   BUN 14 11/18/2021   CREATININE 1.15 11/18/2021   CALCIUM 9.3 11/18/2021   GFRNONAA >60 11/18/2021   GFRAA 106 03/21/2021      Assessment and Plan HIV disease= doing well. Plan on cabenuva injection.  Prehypertension = plan repeat bp. If continues to be elevated at next visit. Plan to start htn meds  See him in 4 months

## 2022-12-17 ENCOUNTER — Other Ambulatory Visit: Payer: Self-pay

## 2022-12-17 ENCOUNTER — Other Ambulatory Visit (HOSPITAL_COMMUNITY): Payer: Self-pay

## 2022-12-19 ENCOUNTER — Other Ambulatory Visit (HOSPITAL_COMMUNITY): Payer: Self-pay

## 2022-12-19 ENCOUNTER — Ambulatory Visit (HOSPITAL_COMMUNITY): Payer: Medicare HMO

## 2022-12-23 ENCOUNTER — Telehealth: Payer: Self-pay

## 2022-12-23 NOTE — Telephone Encounter (Signed)
[facility] Patient Advocate Encounter  Patient's medication Kern Reap) have been couriered to [facility] from [employer] and was administered on patient office visit on 12/16/22.  Ileene Patrick , [facility] Patient [facility] for Infectious Disease Phone: [phone] Fax:  [phone]

## 2023-01-02 ENCOUNTER — Encounter (HOSPITAL_COMMUNITY): Payer: Self-pay

## 2023-01-02 ENCOUNTER — Ambulatory Visit (HOSPITAL_COMMUNITY)
Admission: EM | Admit: 2023-01-02 | Discharge: 2023-01-02 | Disposition: A | Discharge status: Home / Self Care | Payer: Medicare HMO | Attending: Internal Medicine | Admitting: Internal Medicine

## 2023-01-02 DIAGNOSIS — J069 Acute upper respiratory infection, unspecified: Secondary | ICD-10-CM | POA: Diagnosis present

## 2023-01-02 DIAGNOSIS — Z21 Asymptomatic human immunodeficiency virus [HIV] infection status: Secondary | ICD-10-CM | POA: Diagnosis not present

## 2023-01-02 DIAGNOSIS — Z1152 Encounter for screening for COVID-19: Secondary | ICD-10-CM | POA: Diagnosis not present

## 2023-01-02 DIAGNOSIS — R058 Other specified cough: Secondary | ICD-10-CM | POA: Insufficient documentation

## 2023-01-02 LAB — SARS CORONAVIRUS 2 (TAT 6-24 HRS): SARS Coronavirus 2: NEGATIVE

## 2023-01-02 MED ORDER — BENZONATATE 100 MG PO CAPS: 100.0000 mg | ORAL_CAPSULE | Freq: Three times a day (TID) | ORAL | 0 refills | Status: DC

## 2023-01-02 MED ORDER — PROMETHAZINE-DM 6.25-15 MG/5ML PO SYRP: 5.0000 mL | ORAL_SOLUTION | Freq: Every evening | ORAL | 0 refills | Status: DC | PRN

## 2023-01-02 NOTE — Discharge Instructions (Signed)
You have a viral upper respiratory infection.  COVID-19 testing is pending. We will call you with results if positive. If your COVID test is positive, you must stay at home until day 6 of symptoms. On day 6, you may go out into public and go back to work, but you must wear a mask until day 11 of symptoms to prevent spread to others.  Use the following medicines to help with symptoms: - Plain Mucinex (guaifenesin) over the counter as directed every 12 hours to thin mucous so that you are able to get it out of your body easier. Drink plenty of water while taking this medication so that it works well in your body (at least 8 cups a day).  - Tylenol 1,000mg and/or ibuprofen 600mg every 6 hours with food as needed for aches/pains or fever/chills.  - Tessalon perles every 8 hours as needed for cough. - Take Promethazine DM cough medication to help with your cough at nighttime so that you are able to sleep. Do not drive, drink alcohol, or go to work while taking this medication since it can make you sleepy. Only take this at nighttime.   1 tablespoon of honey in warm water and/or salt water gargles may also help with symptoms. Humidifier to your room will help add water to the air and reduce coughing.  If you develop any new or worsening symptoms, please return.  If your symptoms are severe, please go to the emergency room.  Follow-up with your primary care provider for further evaluation and management of your symptoms as well as ongoing wellness visits.  I hope you feel better!  

## 2023-01-02 NOTE — ED Triage Notes (Signed)
Patient c/o sore throat, nasal congestion and a productive cough with green blood -tinged sputum x 3 days.  Patient states he has been taking Mucinex and Nyquil. Last dose was 2 days ago.

## 2023-01-05 NOTE — ED Provider Notes (Signed)
[facility]    CSN: [id number] Arrival date & time: 01/02/23  0805      History   Chief Complaint Chief Complaint  Patient presents with   Sore Throat   Nasal Congestion   Cough    HPI Matthew Schroeder is a 27 y.o. male.   Patient presents to urgent care for evaluation of cough, nasal congestion, sore throat, and generalized fatigue that started 3 days ago.  He has noticed green sputum with productive cough over the last 3 days.  Sore throat is worsened by swallowing, tolerating secretions well without changes in voice sounds or neck pain.  No fever, chills, shortness of breath, body aches, heart palpitations, dizziness, nausea, vomiting, abdominal pain, diarrhea, constipation, new low back pain, headache, or ear pain.  No recent antibiotic or steroid use.  History of HIV, no history of chronic respiratory problems.  He is not a smoker and denies drug use.  Has been using NyQuil and Mucinex over-the-counter without much relief of symptoms.   Sore Throat  Cough   Past Medical History:  Diagnosis Date   HIV disease ([place])     Patient Active Problem List   Diagnosis Date Noted   Healthcare maintenance 11/20/2018   Mixed bipolar I disorder ([place]) 07/16/2016   Attention deficit hyperactivity disorder (ADHD) 07/16/2016   HIV (human immunodeficiency virus infection) ([place]) 06/08/2016    Past Surgical History:  Procedure Laterality Date   NO PAST SURGERIES         Home Medications    Prior to Admission medications   Medication Sig Start Date End Date Taking? Authorizing Provider  benzonatate (TESSALON) 100 MG capsule Take 1 capsule (100 mg total) by mouth every 8 (eight) hours. 01/02/23  Yes Talbot Grumbling, FNP  promethazine-dextromethorphan (PROMETHAZINE-DM) 6.25-15 MG/5ML syrup Take 5 mLs by mouth at bedtime as needed for cough. 01/02/23  Yes Daphney Hopke, Stasia Cavalier, FNP  cabotegravir & rilpivirine ER (CABENUVA) 600 & 900 MG/3ML injection Inject 1 kit into  the muscle every 30 (thirty) days. 07/16/22   Esmond Plants, RPH-CPP  cabotegravir & rilpivirine ER (CABENUVA) 600 & 900 MG/3ML injection Inject 1 kit into the muscle every 2 (two) months. 07/16/22   Esmond Plants, RPH-CPP  HYDROcodone-acetaminophen (NORCO/VICODIN) 5-325 MG tablet Take 1 tablet by mouth every 4 (four) hours as needed for moderate pain. 07/31/22 07/31/23  Fransico Meadow, PA-C  hydrocortisone (PROCTOZONE-HC) 2.5 % rectal cream Place 1 application rectally 2 (two) times daily. Patient not taking: Reported on 07/08/2022 02/23/22   Geryl Councilman L, PA  phentermine (ADIPEX-P) 37.5 MG tablet Take 37.5 mg by mouth every morning. 04/30/22   [provider]    Family History Family History  Problem Relation Age of Onset   Colon cancer Neg Hx    Esophageal cancer Neg Hx    Stomach cancer Neg Hx     Social History Social History   Tobacco Use   Smoking status: Never   Smokeless tobacco: Never  Vaping Use   Vaping Use: Never used  Substance Use Topics   Alcohol use: No   Drug use: No     Allergies   Patient has no known allergies.   Review of Systems Review of Systems  Respiratory:  Positive for cough.      Physical Exam Triage Vital Signs ED Triage Vitals [01/02/23 0820]  Enc Vitals Group     BP (!) 150/101     Pulse Rate 96  Resp 16     Temp 99.4 F (37.4 C)     Temp Source Oral     SpO2 96 %     Weight      Height      Head Circumference      Peak Flow      Pain Score 1     Pain Loc      Pain Edu?      Excl. in [place]?    No data found.  Updated Vital Signs BP (!) 150/101 (BP Location: Left Arm)   Pulse 96   Temp 99.4 F (37.4 C) (Oral)   Resp 16   SpO2 96%   Visual Acuity Right Eye Distance:   Left Eye Distance:   Bilateral Distance:    Right Eye Near:   Left Eye Near:    Bilateral Near:     Physical Exam Vitals and nursing note reviewed.  Constitutional:      Appearance: He is not ill-appearing or toxic-appearing.  HENT:      Head: Normocephalic and atraumatic.     Right Ear: Hearing, tympanic membrane, ear canal and external ear normal.     Left Ear: Hearing, tympanic membrane, ear canal and external ear normal.     Nose: Congestion present.     Mouth/Throat:     Lips: Pink.     Mouth: Mucous membranes are moist. No injury.     Tongue: No lesions. Tongue does not deviate from midline.     Palate: No mass and lesions.     Pharynx: Oropharynx is clear. Uvula midline. Posterior oropharyngeal erythema present. No pharyngeal swelling, oropharyngeal exudate or uvula swelling.     Tonsils: No tonsillar exudate or tonsillar abscesses. 0 on the right. 0 on the left.  Eyes:     General: Lids are normal. Vision grossly intact. Gaze aligned appropriately.     Extraocular Movements: Extraocular movements intact.     Conjunctiva/sclera: Conjunctivae normal.  Cardiovascular:     Rate and Rhythm: Normal rate and regular rhythm.     Heart sounds: Normal heart sounds, S1 normal and S2 normal.  Pulmonary:     Effort: Pulmonary effort is normal. No respiratory distress.     Breath sounds: Normal breath sounds and air entry. No stridor. No wheezing, rhonchi or rales.  Musculoskeletal:     Cervical back: Neck supple.  Skin:    General: Skin is warm and dry.     Capillary Refill: Capillary refill takes less than 2 seconds.     Findings: No rash.  Neurological:     General: No focal deficit present.     Mental Status: He is alert and oriented to person, place, and time. Mental status is at baseline.     Cranial Nerves: No dysarthria or facial asymmetry.  Psychiatric:        Mood and Affect: Mood normal.        Speech: Speech normal.        Behavior: Behavior normal.        Thought Content: Thought content normal.        Judgment: Judgment normal.      UC Treatments / Results  Labs (all labs ordered are listed, but only abnormal results are displayed) Labs Reviewed  SARS CORONAVIRUS 2 (TAT 6-24 HRS)     EKG   Radiology No results found.  Procedures Procedures (including critical care time)  Medications Ordered in UC Medications - No data to display  Initial Impression / Assessment and Plan / UC Course  I have reviewed the triage vital signs and the nursing notes.  Pertinent labs & imaging results that were available during my care of the patient were reviewed by me and considered in my medical decision making (see chart for details).   1. Viral URI with cough Symptoms and physical exam consistent with a viral upper respiratory tract infection that will likely resolve with rest, fluids, and prescriptions for symptomatic relief. Deferred imaging based on stable cardiopulmonary exam and hemodynamically stable vital signs.  COVID-19 testing is pending.  We will call patient if this is positive.  Quarantine guidelines discussed. Currently on day 4 of symptoms and does qualify for antiviral therapy due to history of HIV.   Tessalon Perles and Promethazine DM cough syrup sent to pharmacy for symptomatic relief to be taken as prescribed.  May continue taking over the counter medications as directed for further symptomatic relief.  Drowsiness precautions discussed regarding promethazine DM prescription.  Nonpharmacologic interventions for symptom relief provided and after visit summary below. Advised to push fluids to stay well hydrated while recovering from viral illness.   Discussed physical exam and available lab work findings in clinic with patient.  Counseled patient regarding appropriate use of medications and potential side effects for all medications recommended or prescribed today. Discussed red flag signs and symptoms of worsening condition,when to call the PCP office, return to urgent care, and when to seek higher level of care in the emergency department. Patient verbalizes understanding and agreement with plan. All questions answered. Patient discharged in stable  condition.    Final Clinical Impressions(s) / UC Diagnoses   Final diagnoses:  Viral URI with cough     Discharge Instructions      You have a viral upper respiratory infection.  COVID-19 testing is pending. We will call you with results if positive. If your COVID test is positive, you must stay at home until day 6 of symptoms. On day 6, you may go out into public and go back to work, but you must wear a mask until day 11 of symptoms to prevent spread to others.  Use the following medicines to help with symptoms: - Plain Mucinex (guaifenesin) over the counter as directed every 12 hours to thin mucous so that you are able to get it out of your body easier. Drink plenty of water while taking this medication so that it works well in your body (at least 8 cups a day).  - Tylenol 1,067m and/or ibuprofen 609mevery 6 hours with food as needed for aches/pains or fever/chills.  - Tessalon perles every 8 hours as needed for cough. - Take Promethazine DM cough medication to help with your cough at nighttime so that you are able to sleep. Do not drive, drink alcohol, or go to work while taking this medication since it can make you sleepy. Only take this at nighttime.   1 tablespoon of honey in warm water and/or salt water gargles may also help with symptoms. Humidifier to your room will help add water to the air and reduce coughing.  If you develop any new or worsening symptoms, please return.  If your symptoms are severe, please go to the emergency room.  Follow-up with your primary care provider for further evaluation and management of your symptoms as well as ongoing wellness visits.  I hope you feel better!   ED Prescriptions     Medication Sig Dispense Auth. Provider   promethazine-dextromethorphan (  PROMETHAZINE-DM) 6.25-15 MG/5ML syrup Take 5 mLs by mouth at bedtime as needed for cough. 118 mL Joella Prince M, FNP   benzonatate (TESSALON) 100 MG capsule Take 1 capsule (100 mg  total) by mouth every 8 (eight) hours. 21 capsule Talbot Grumbling, FNP      PDMP not reviewed this encounter.   Talbot Grumbling,  01/05/23 2107

## 2023-02-04 ENCOUNTER — Other Ambulatory Visit (HOSPITAL_COMMUNITY): Payer: Self-pay

## 2023-02-06 ENCOUNTER — Other Ambulatory Visit (HOSPITAL_COMMUNITY): Payer: Self-pay

## 2023-02-07 ENCOUNTER — Other Ambulatory Visit (HOSPITAL_COMMUNITY): Payer: Self-pay

## 2023-02-11 ENCOUNTER — Other Ambulatory Visit: Payer: Self-pay

## 2023-02-13 ENCOUNTER — Telehealth: Payer: Self-pay

## 2023-02-13 NOTE — Telephone Encounter (Signed)
[facility] Patient Advocate Encounter  Patient's medication Matthew Schroeder) have been couriered to [facility] from [employer] and will be administered on the patient next office visit on 02/17/23.  Ileene Patrick , [facility] Patient [facility] for Infectious Disease Phone: [phone] Fax:  [phone]

## 2023-02-17 ENCOUNTER — Telehealth: Payer: Self-pay

## 2023-02-17 ENCOUNTER — Encounter: Payer: Medicare HMO | Admitting: Pharmacist

## 2023-02-17 NOTE — Telephone Encounter (Signed)
Pt called office and left vm to sched Cab appt for 02/19/2023 at 11:15am. Attempted to call pt back to reschedule for him. VM box has not been set up to do so.

## 2023-02-19 ENCOUNTER — Ambulatory Visit (INDEPENDENT_AMBULATORY_CARE_PROVIDER_SITE_OTHER): Payer: Medicare HMO | Admitting: Pharmacist

## 2023-02-19 ENCOUNTER — Other Ambulatory Visit: Payer: Self-pay

## 2023-02-19 DIAGNOSIS — B20 Human immunodeficiency virus [HIV] disease: Secondary | ICD-10-CM

## 2023-02-19 MED ORDER — CABOTEGRAVIR & RILPIVIRINE ER 600 & 900 MG/3ML IM SUER
1.0000 | Freq: Once | INTRAMUSCULAR | Status: AC
  Administered 2023-02-19: 1 via INTRAMUSCULAR

## 2023-02-19 NOTE — Progress Notes (Signed)
HPI: Matthew Schroeder is a 27 y.o. male who presents to the [place] clinic for [place] administration.  Patient Active Problem List   Diagnosis Date Noted   Healthcare maintenance 11/20/2018   Mixed bipolar I disorder ([place]) 07/16/2016   Attention deficit hyperactivity disorder (ADHD) 07/16/2016   HIV (human immunodeficiency virus infection) ([place]) 06/08/2016    Patient's Medications  New Prescriptions   No medications on file  Previous Medications   BENZONATATE (TESSALON) 100 MG CAPSULE    Take 1 capsule (100 mg total) by mouth every 8 (eight) hours.   CABOTEGRAVIR & RILPIVIRINE ER (CABENUVA) 600 & 900 MG/3ML INJECTION    Inject 1 kit into the muscle every 30 (thirty) days.   CABOTEGRAVIR & RILPIVIRINE ER (CABENUVA) 600 & 900 MG/3ML INJECTION    Inject 1 kit into the muscle every 2 (two) months.   HYDROCODONE-ACETAMINOPHEN (NORCO/VICODIN) 5-325 MG TABLET    Take 1 tablet by mouth every 4 (four) hours as needed for moderate pain.   HYDROCORTISONE (PROCTOZONE-HC) 2.5 % RECTAL CREAM    Place 1 application rectally 2 (two) times daily.   PHENTERMINE (ADIPEX-P) 37.5 MG TABLET    Take 37.5 mg by mouth every morning.   PROMETHAZINE-DEXTROMETHORPHAN (PROMETHAZINE-DM) 6.25-15 MG/5ML SYRUP    Take 5 mLs by mouth at bedtime as needed for cough.  Modified Medications   No medications on file  Discontinued Medications   No medications on file    Allergies: No Known Allergies  Past Medical History: Past Medical History:  Diagnosis Date   HIV disease ([place])     Social History: Social History   Socioeconomic History   Marital status: Single    Spouse name: Not on file   Number of children: 0   Years of education: Not on file   Highest education level: Not on file  Occupational History   Occupation: Phlebotomist  Tobacco Use   Smoking status: Never   Smokeless tobacco: Never  Vaping Use   Vaping Use: Never used  Substance and Sexual Activity   Alcohol use: No   Drug  use: No   Sexual activity: Not Currently    Partners: Male    Birth control/protection: Condom    Comment: declined condoms  Other Topics Concern   Not on file  Social History Narrative   ** Merged History Encounter **       Social Determinants of Health   Financial Resource Strain: Not on file  Food Insecurity: Not on file  Transportation Needs: Not on file  Physical Activity: Not on file  Stress: Not on file  Social Connections: Not on file    Labs: Lab Results  Component Value Date   HIV1RNAQUANT Not Detected 10/29/2022   HIV1RNAQUANT Not Detected 08/26/2022   HIV1RNAQUANT NOT DETECTED 07/08/2022   CD4TABS 724 11/13/2021   CD4TABS 1,278 03/21/2021   CD4TABS 984 09/12/2020    RPR and STI Lab Results  Component Value Date   LABRPR NON-REACTIVE 10/29/2022   LABRPR NON-REACTIVE 07/23/2022   LABRPR REACTIVE (A) 11/13/2021   LABRPR REACTIVE (A) 03/21/2021   LABRPR REACTIVE (A) 09/12/2020   RPRTITER 1:1 (H) 11/13/2021   RPRTITER 1:1 (H) 03/21/2021   RPRTITER 1:2 (H) 09/12/2020   RPRTITER 1:4 (H) 05/10/2020   RPRTITER 1:2 (H) 11/20/2018    STI Results GC CT  10/29/2022 10:24 AM Negative    Negative  Negative    Negative   09/12/2020  3:28 PM Negative  Negative   06/10/2019 12:00  AM Negative  Negative   05/05/2018 12:00 AM **POSITIVE**  Negative   09/02/2017 12:00 AM Negative  Negative     Hepatitis B Lab Results  Component Value Date   HEPBSAB REACTIVE (A) 07/23/2022   HEPBSAG NON-REACTIVE 07/23/2022   Hepatitis C Lab Results  Component Value Date   HEPCAB NON-REACTIVE 07/23/2022   Hepatitis A Lab Results  Component Value Date   HAV REACTIVE (A) 07/23/2022   Lipids: Lab Results  Component Value Date   CHOL 206 (H) 03/21/2021   TRIG 153 (H) 03/21/2021   HDL 47 03/21/2021   CHOLHDL 4.4 03/21/2021   LDLCALC 132 (H) 03/21/2021    TARGET DATE: The 29th  Assessment: Matthew Schroeder presents today for his maintenance Cabenuva injections. Past  injections were tolerated well without issues. We will get an HIV RNA today as this was last collected in December 2023. He reports no signs/symptoms of an STI and politely declines STI testing today. He is up to date on all vaccines and is eligible for PCV20 in July 2024.  Administered cabotegravir 600mg /82mL in left upper outer quadrant of the gluteal muscle. Administered rilpivirine 900 mg/26mL in the right upper outer quadrant of the gluteal muscle. No issues with injections. He will follow up in 2 months for next set of injections.  Plan: - Cabenuva injections administered - Get HIV RNA today  - Next injections scheduled for 04/22/23 with Dr. Baxter Flattery and 06/18/23 with Cassie - Call with any issues or questions   Billey Gosling, PharmD PGY1 Pharmacy Resident 3/27/202411:38 AM

## 2023-02-22 LAB — HIV-1 RNA QUANT-NO REFLEX-BLD
HIV 1 RNA Quant: NOT DETECTED Copies/mL
HIV-1 RNA Quant, Log: NOT DETECTED Log cps/mL

## 2023-03-20 ENCOUNTER — Encounter (HOSPITAL_COMMUNITY): Payer: Self-pay

## 2023-03-20 ENCOUNTER — Emergency Department (HOSPITAL_COMMUNITY)
Admission: EM | Admit: 2023-03-20 | Discharge: 2023-03-20 | Disposition: A | Discharge status: Home / Self Care | Payer: Medicare HMO | Attending: Emergency Medicine | Admitting: Emergency Medicine

## 2023-03-20 DIAGNOSIS — Z1152 Encounter for screening for COVID-19: Secondary | ICD-10-CM | POA: Insufficient documentation

## 2023-03-20 DIAGNOSIS — Z20822 Contact with and (suspected) exposure to covid-19: Secondary | ICD-10-CM | POA: Insufficient documentation

## 2023-03-20 DIAGNOSIS — Z139 Encounter for screening, unspecified: Secondary | ICD-10-CM

## 2023-03-20 LAB — RESP PANEL BY RT-PCR (RSV, FLU A&B, COVID)  RVPGX2
Influenza A by PCR: NEGATIVE
Influenza B by PCR: NEGATIVE
Resp Syncytial Virus by PCR: NEGATIVE
SARS Coronavirus 2 by RT PCR: NEGATIVE

## 2023-03-20 NOTE — ED Triage Notes (Signed)
Here arrived POV wanting a COVID test, reports co-worker + COVID. Pt reports runny nose. Denies pain or other s/s. NAD noted.

## 2023-03-20 NOTE — Discharge Instructions (Signed)
Today your COVID results were negative.  Return to the ER if you have severe shortness of breath, fever, chills.  Just because your COVID test was negative today, does not mean that you may not develop COVID.  You should notice symptoms within the next 3 to 7 days if you contacted COVID.

## 2023-03-20 NOTE — ED Provider Notes (Signed)
[place] EMERGENCY DEPARTMENT AT [facility] Provider Note   CSN: [id number] Arrival date & time: 03/20/23  1902     History  Chief Complaint  Patient presents with   COVID TEST    Matthew Schroeder is a 27 y.o. male, no pertinent past medical history, who presents to the ED secondary to needing a COVID test.  He states that he has a Radio broadcast assistant, that he was hiking today, and just found out she has COVID.  He denies any symptoms, and does not have any shortness of breath, runny nose, sore throat, headache.  States he just wants to be tested for COVID.    Home Medications Prior to Admission medications   Medication Sig Start Date End Date Taking? Authorizing Provider  benzonatate (TESSALON) 100 MG capsule Take 1 capsule (100 mg total) by mouth every 8 (eight) hours. 01/02/23   Carlisle Beers, FNP  cabotegravir & rilpivirine ER (CABENUVA) 600 & 900 MG/3ML injection Inject 1 kit into the muscle every 30 (thirty) days. 07/16/22   Jennette Kettle, RPH-CPP  cabotegravir & rilpivirine ER (CABENUVA) 600 & 900 MG/3ML injection Inject 1 kit into the muscle every 2 (two) months. 07/16/22   Jennette Kettle, RPH-CPP  HYDROcodone-acetaminophen (NORCO/VICODIN) 5-325 MG tablet Take 1 tablet by mouth every 4 (four) hours as needed for moderate pain. 07/31/22 07/31/23  Elson Areas, PA-C  hydrocortisone (PROCTOZONE-HC) 2.5 % rectal cream Place 1 application rectally 2 (two) times daily. Patient not taking: Reported on 07/08/2022 02/23/22   Guy Sandifer L, PA  phentermine (ADIPEX-P) 37.5 MG tablet Take 37.5 mg by mouth every morning. 04/30/22   [provider]  promethazine-dextromethorphan (PROMETHAZINE-DM) 6.25-15 MG/5ML syrup Take 5 mLs by mouth at bedtime as needed for cough. 01/02/23   Carlisle Beers, FNP      Allergies    Patient has no known allergies.    Review of Systems   Review of Systems  Respiratory:  Negative for shortness of breath.     Physical Exam Updated  Vital Signs BP (!) 152/112 (BP Location: Left Arm)   Pulse 69   Temp 98.8 F (37.1 C) (Oral)   Resp 17   Ht  (1.626 m)   Wt 104.3 kg   SpO2 100%   BMI 39.48 kg/m  Physical Exam Vitals and nursing note reviewed.  Constitutional:      General: He is not in acute distress.    Appearance: He is well-developed.  HENT:     Head: Normocephalic and atraumatic.  Eyes:     Conjunctiva/sclera: Conjunctivae normal.  Cardiovascular:     Rate and Rhythm: Normal rate and regular rhythm.     Heart sounds: No murmur heard. Pulmonary:     Effort: Pulmonary effort is normal. No respiratory distress.     Breath sounds: Normal breath sounds.  Abdominal:     Palpations: Abdomen is soft.     Tenderness: There is no abdominal tenderness.  Musculoskeletal:        General: No swelling.     Cervical back: Neck supple.  Skin:    General: Skin is warm and dry.     Capillary Refill: Capillary refill takes less than 2 seconds.  Neurological:     Mental Status: He is alert.  Psychiatric:        Mood and Affect: Mood normal.     ED Results / Procedures / Treatments   Labs (all labs ordered are listed, but only  abnormal results are displayed) Labs Reviewed  RESP PANEL BY RT-PCR (RSV, FLU A&B, COVID)  [id number]    EKG None  Radiology No results found.  Procedures Procedures    Medications Ordered in ED Medications - No data to display  ED Course/ Medical Decision Making/ A&P                             Medical Decision Making Patient is asymptomatic, requesting COVID test, COVID test ordered.  Has no complaints.  Amount and/or Complexity of Data Reviewed Discussion of management or test interpretation with external provider(s): Discussed with patient, he is negative for COVID, even though he is negative For COVID now, does not mean that he does not have COVID or will not develop COVID.  We discussed return precautions and he voiced understanding.   Final Clinical Impression(s)  / ED Diagnoses Final diagnoses:  Encounter for medical screening examination  Close exposure to COVID-19 virus    Rx / DC Orders ED Discharge Orders     None         Jeno Calleros, [place], PA 03/20/23 2032    Loetta Rough, MD 03/21/23 [phone]

## 2023-04-03 ENCOUNTER — Other Ambulatory Visit (HOSPITAL_COMMUNITY): Payer: Self-pay

## 2023-04-15 ENCOUNTER — Other Ambulatory Visit (HOSPITAL_COMMUNITY): Payer: Self-pay

## 2023-04-17 ENCOUNTER — Telehealth: Payer: Self-pay

## 2023-04-17 NOTE — Telephone Encounter (Signed)
[facility] Patient Advocate Encounter  Patient's medication (Cabenuva) have been couriered to [facility] from [facility] and will be administered on the patient next office visit on 04/22/23.  Anastasija Anfinson , CPhT Specialty Pharmacy Patient Advocate [facility] Phone: [phone] Fax:  [phone]  

## 2023-04-22 ENCOUNTER — Ambulatory Visit: Payer: Medicare HMO | Admitting: Internal Medicine

## 2023-04-30 ENCOUNTER — Other Ambulatory Visit: Payer: Self-pay

## 2023-04-30 ENCOUNTER — Ambulatory Visit (INDEPENDENT_AMBULATORY_CARE_PROVIDER_SITE_OTHER): Payer: Medicare HMO | Admitting: Pharmacist

## 2023-04-30 ENCOUNTER — Other Ambulatory Visit (HOSPITAL_COMMUNITY)
Admission: RE | Admit: 2023-04-30 | Discharge: 2023-04-30 | Disposition: A | Discharge status: Home / Self Care | Payer: Medicare HMO | Source: Ambulatory Visit | Attending: Internal Medicine | Admitting: Internal Medicine

## 2023-04-30 DIAGNOSIS — B20 Human immunodeficiency virus [HIV] disease: Secondary | ICD-10-CM

## 2023-04-30 DIAGNOSIS — Z113 Encounter for screening for infections with a predominantly sexual mode of transmission: Secondary | ICD-10-CM | POA: Insufficient documentation

## 2023-04-30 MED ORDER — CABOTEGRAVIR & RILPIVIRINE ER 600 & 900 MG/3ML IM SUER
1.0000 | Freq: Once | INTRAMUSCULAR | Status: AC
  Administered 2023-04-30: 1 via INTRAMUSCULAR

## 2023-04-30 NOTE — Progress Notes (Signed)
HPI: Matthew Schroeder is a 27 y.o. male who presents to the [facility] pharmacy clinic for [place] administration.  Patient Active Problem List   Diagnosis Date Noted   Healthcare maintenance 11/20/2018   Mixed bipolar I disorder (H[facility]) 07/16/2016   Attention deficit hyperactivity disorder (ADHD) 07/16/2016   HIV (human immunodeficiency virus infection) (H[facility]) 06/08/2016    Patient's Medications  New Prescriptions   No medications on file  Previous Medications   BENZONATATE (TESSALON) 100 MG CAPSULE    Take 1 capsule (100 mg total) by mouth every 8 (eight) hours.   CABOTEGRAVIR & RILPIVIRINE ER (CABENUVA) 600 & 900 MG/3ML INJECTION    Inject 1 kit into the muscle every 30 (thirty) days.   CABOTEGRAVIR & RILPIVIRINE ER (CABENUVA) 600 & 900 MG/3ML INJECTION    Inject 1 kit into the muscle every 2 (two) months.   HYDROCODONE-ACETAMINOPHEN (NORCO/VICODIN) 5-325 MG TABLET    Take 1 tablet by mouth every 4 (four) hours as needed for moderate pain.   HYDROCORTISONE (PROCTOZONE-HC) 2.5 % RECTAL CREAM    Place 1 application rectally 2 (two) times daily.   PHENTERMINE (ADIPEX-P) 37.5 MG TABLET    Take 37.5 mg by mouth every morning.   PROMETHAZINE-DEXTROMETHORPHAN (PROMETHAZINE-DM) 6.25-15 MG/5ML SYRUP    Take 5 mLs by mouth at bedtime as needed for cough.  Modified Medications   No medications on file  Discontinued Medications   No medications on file    Allergies: No Known Allergies  Past Medical History: Past Medical History:  Diagnosis Date   HIV disease (HCC)     Social History: Social History   Socioeconomic History   Marital status: Single    Spouse name: Not on file   Number of children: 0   Years of education: Not on file   Highest education level: Not on file  Occupational History   Occupation: Phlebotomist  Tobacco Use   Smoking status: Never   Smokeless tobacco: Never  Vaping Use   Vaping Use: Never used  Substance and Sexual Activity   Alcohol use: No   Drug  use: Yes    Types: Marijuana   Sexual activity: Not Currently    Partners: Male    Birth control/protection: Condom    Comment: declined condoms  Other Topics Concern   Not on file  Social History Narrative   ** Merged History Encounter **       Social Determinants of Health   Financial Resource Strain: Not on file  Food Insecurity: Not on file  Transportation Needs: Not on file  Physical Activity: Not on file  Stress: Not on file  Social Connections: Not on file    Labs: Lab Results  Component Value Date   HIV1RNAQUANT Not Detected 02/19/2023   HIV1RNAQUANT Not Detected 10/29/2022   HIV1RNAQUANT Not Detected 08/26/2022   CD4TABS 724 11/13/2021   CD4TABS 1,278 03/21/2021   CD4TABS 984 09/12/2020    RPR and STI Lab Results  Component Value Date   LABRPR NON-REACTIVE 10/29/2022   LABRPR NON-REACTIVE 07/23/2022   LABRPR REACTIVE (A) 11/13/2021   LABRPR REACTIVE (A) 03/21/2021   LABRPR REACTIVE (A) 09/12/2020   RPRTITER 1:1 (H) 11/13/2021   RPRTITER 1:1 (H) 03/21/2021   RPRTITER 1:2 (H) 09/12/2020   RPRTITER 1:4 (H) 05/10/2020   RPRTITER 1:2 (H) 11/20/2018    STI Results GC CT  10/29/2022 10:24 AM Negative    Negative  Negative    Negative   09/12/2020  3:28 PM Negative  Negative   06/10/2019 12:00 AM Negative  Negative   05/05/2018 12:00 AM **POSITIVE**  Negative   09/02/2017 12:00 AM Negative  Negative     Hepatitis B Lab Results  Component Value Date   HEPBSAB REACTIVE (A) 07/23/2022   HEPBSAG NON-REACTIVE 07/23/2022   Hepatitis C Lab Results  Component Value Date   HEPCAB NON-REACTIVE 07/23/2022   Hepatitis A Lab Results  Component Value Date   HAV REACTIVE (A) 07/23/2022   Lipids: Lab Results  Component Value Date   CHOL 206 (H) 03/21/2021   TRIG 153 (H) 03/21/2021   HDL 47 03/21/2021   CHOLHDL 4.4 03/21/2021   LDLCALC 132 (H) 03/21/2021    TARGET DATE: The 29th  Assessment: Bravin presents today for his maintenance Cabenuva  injections. He missed an appointment with Dr. Drue Second last week. Will schedule his next set of injections with her. Requests urine cytology today. Will check a HIV viral load and RPR as well.   Administered cabotegravir 600mg /21mL in left upper outer quadrant of the gluteal muscle. Administered rilpivirine 900 mg/43mL in the right upper outer quadrant of the gluteal muscle. No issues with injections. He will follow up in 2 months for next set of injections.  Plan: - Cabenuva injections administered - HIV RNA, RPR, and urine cytology today - Next injections scheduled for 06/17/23 with Dr. Drue Second and 08/18/23 with me - Call with any issues or questions  Drequan Ironside L. Rey Fors, PharmD, BCIDP, AAHIVP, CPP Clinical Pharmacist Practitioner Infectious Diseases Clinical Pharmacist [facility]

## 2023-05-01 LAB — URINE CYTOLOGY ANCILLARY ONLY
Chlamydia: NEGATIVE
Comment: NEGATIVE
Comment: NORMAL
Neisseria Gonorrhea: NEGATIVE

## 2023-05-02 LAB — T PALLIDUM AB: T Pallidum Abs: POSITIVE — AB

## 2023-05-02 LAB — RPR: RPR Ser Ql: REACTIVE — AB

## 2023-05-02 LAB — RPR TITER: RPR Titer: 1:1 {titer} — ABNORMAL HIGH

## 2023-05-02 LAB — HIV-1 RNA QUANT-NO REFLEX-BLD
HIV 1 RNA Quant: NOT DETECTED Copies/mL
HIV-1 RNA Quant, Log: NOT DETECTED Log cps/mL

## 2023-05-03 ENCOUNTER — Encounter (HOSPITAL_COMMUNITY): Payer: Self-pay | Admitting: Emergency Medicine

## 2023-05-03 ENCOUNTER — Telehealth: Payer: Medicare HMO | Admitting: Nurse Practitioner

## 2023-05-03 ENCOUNTER — Ambulatory Visit (HOSPITAL_COMMUNITY)
Admission: EM | Admit: 2023-05-03 | Discharge: 2023-05-03 | Disposition: A | Discharge status: Home / Self Care | Payer: Medicare HMO | Attending: Internal Medicine | Admitting: Internal Medicine

## 2023-05-03 DIAGNOSIS — A539 Syphilis, unspecified: Secondary | ICD-10-CM

## 2023-05-03 MED ORDER — PENICILLIN G BENZATHINE 1200000 UNIT/2ML IM SUSY
PREFILLED_SYRINGE | INTRAMUSCULAR | Status: AC
  Filled 2023-05-03: qty 4

## 2023-05-03 MED ORDER — PENICILLIN G BENZATHINE 1200000 UNIT/2ML IM SUSY
2.4000 10*6.[IU] | PREFILLED_SYRINGE | Freq: Once | INTRAMUSCULAR | Status: AC
Start: 2023-05-03 — End: 2023-05-03
  Administered 2023-05-03: 2.4 10*6.[IU] via INTRAMUSCULAR

## 2023-05-03 NOTE — Discharge Instructions (Signed)
We gave you a penicillin injection today to treat your syphilis infection.  Do not have any intercourse for 7 days while undergoing treatment.  Please use condoms in the future to prevent spread of STDs.  Return to urgent care as needed.

## 2023-05-03 NOTE — Patient Instructions (Signed)
  Gertie Exon Scantlin, thank you for joining Bennie Pierini, FNP for today's virtual visit.  While this provider is not your primary care provider (PCP), if your PCP is located in our provider database this encounter information will be shared with them immediately following your visit.   A [facility] MyChart account gives you access to today's visit and all your visits, tests, and labs performed at [facility] " click here if you don't have a [facility] MyChart account or go to [email or web address]  Consent: (Patient) Matthew Schroeder provided verbal consent for this virtual visit at the beginning of the encounter.  Current Medications:  Current Outpatient Medications:    benzonatate (TESSALON) 100 MG capsule, Take 1 capsule (100 mg total) by mouth every 8 (eight) hours., Disp: 21 capsule, Rfl: 0   cabotegravir & rilpivirine ER (CABENUVA) 600 & 900 MG/3ML injection, Inject 1 kit into the muscle every 30 (thirty) days., Disp: 6 mL, Rfl: 1   cabotegravir & rilpivirine ER (CABENUVA) 600 & 900 MG/3ML injection, Inject 1 kit into the muscle every 2 (two) months., Disp: 6 mL, Rfl: 5   HYDROcodone-acetaminophen (NORCO/VICODIN) 5-325 MG tablet, Take 1 tablet by mouth every 4 (four) hours as needed for moderate pain., Disp: 12 tablet, Rfl: 0   hydrocortisone (PROCTOZONE-HC) 2.5 % rectal cream, Place 1 application rectally 2 (two) times daily. (Patient not taking: Reported on 07/08/2022), Disp: 30 g, Rfl: 0   phentermine (ADIPEX-P) 37.5 MG tablet, Take 37.5 mg by mouth every morning., Disp: , Rfl:    promethazine-dextromethorphan (PROMETHAZINE-DM) 6.25-15 MG/5ML syrup, Take 5 mLs by mouth at bedtime as needed for cough., Disp: 118 mL, Rfl: 0   Medications ordered in this encounter:  No orders of the defined types were placed in this encounter.    *If you need refills on other medications prior to your next appointment, please contact your pharmacy*  Follow-Up: Call back or seek  an in-person evaluation if the symptoms worsen or if the condition fails to improve as anticipated.  [facility] [phone]  Other Instructions Lease do not delay treatment   If you have been instructed to have an in-person evaluation today at a local Urgent Care facility, please use the link below. It will take you to a list of all of our available [facility], including address, phone number and hours of operation. Please do not delay care.  [facility]  If you or a family member do not have a primary care provider, use the link below to schedule a visit and establish care. When you choose a [facility] primary care physician or advanced practice provider, you gain a long-term partner in health. Find a Primary Care Provider  Learn more about [facility]'s in-office and virtual care options:  - Get Care Now

## 2023-05-03 NOTE — Progress Notes (Signed)
Virtual Visit Consent   [name], you are scheduled for a virtual visit with [name], FNP, a [facility] provider, today.     Just as with appointments in the office, your consent must be obtained to participate.  Your consent will be active for this visit and any virtual visit you may have with one of our providers in the next 365 days.     If you have a MyChart account, a copy of this consent can be sent to you electronically.  All virtual visits are billed to your insurance company just like a traditional visit in the office.    As this is a virtual visit, video technology does not allow for your provider to perform a traditional examination.  This may limit your provider's ability to fully assess your condition.  If your provider identifies any concerns that need to be evaluated in person or the need to arrange testing (such as labs, EKG, etc.), we will make arrangements to do so.     Although advances in technology are sophisticated, we cannot ensure that it will always work on either your end or our end.  If the connection with a video visit is poor, the visit may have to be switched to a telephone visit.  With either a video or telephone visit, we are not always able to ensure that we have a secure connection.     I need to obtain your verbal consent now.   Are you willing to proceed with your visit today? YES   Matthew Schroeder Camera has provided verbal consent on 05/03/2023 for a virtual visit (video or telephone).   [name], FNP   Date: 05/03/2023 3:34 PM   Virtual Visit via Video Note   I, Mary-Margaret Louay Myrie, connected with [name] ([id number], Apr 21, 1996) on 05/03/23 at  3:00 PM EDT by a video-enabled telemedicine application and verified that I am speaking with the correct person using two identifiers.  Location: Patient: Virtual Visit Location Patient: Home Provider: Virtual Visit Location Provider: Mobile   I discussed the  limitations of evaluation and management by telemedicine and the availability of in person appointments. The patient expressed understanding and agreed to proceed.    History of Present Illness: ATHONY Schroeder is a 27 y.o. who identifies as a male who was assigned male at birth, and is being seen today for positive syphillis.  HPI: Patient recently had syphyllis results that came back reactive. Went to centers for infective disease and had test ran and that one came back positive.     Review of Systems  Constitutional:  Negative for diaphoresis and weight loss.  Eyes:  Negative for blurred vision, double vision and pain.  Respiratory:  Negative for shortness of breath.   Cardiovascular:  Negative for chest pain, palpitations, orthopnea and leg swelling.  Gastrointestinal:  Negative for abdominal pain.  Skin:  Negative for rash.  Neurological:  Negative for dizziness, sensory change, loss of consciousness, weakness and headaches.  Endo/Heme/Allergies:  Negative for polydipsia. Does not bruise/bleed easily.  Psychiatric/Behavioral:  Negative for memory loss. The patient does not have insomnia.   All other systems reviewed and are negative.   Problems:  Patient Active Problem List   Diagnosis Date Noted   Healthcare maintenance 11/20/2018   Mixed bipolar I disorder (HCC) 07/16/2016   Attention deficit hyperactivity disorder (ADHD) 07/16/2016   HIV (human immunodeficiency virus infection) (HCC) 06/08/2016    Allergies: No Known Allergies Medications:  Current  Outpatient Medications:    benzonatate (TESSALON) 100 MG capsule, Take 1 capsule (100 mg total) by mouth every 8 (eight) hours., Disp: 21 capsule, Rfl: 0   cabotegravir & rilpivirine ER (CABENUVA) 600 & 900 MG/3ML injection, Inject 1 kit into the muscle every 30 (thirty) days., Disp: 6 mL, Rfl: 1   cabotegravir & rilpivirine ER (CABENUVA) 600 & 900 MG/3ML injection, Inject 1 kit into the muscle every 2 (two) months., Disp: 6 mL,  Rfl: 5   HYDROcodone-acetaminophen (NORCO/VICODIN) 5-325 MG tablet, Take 1 tablet by mouth every 4 (four) hours as needed for moderate pain., Disp: 12 tablet, Rfl: 0   hydrocortisone (PROCTOZONE-HC) 2.5 % rectal cream, Place 1 application rectally 2 (two) times daily. (Patient not taking: Reported on 07/08/2022), Disp: 30 g, Rfl: 0   phentermine (ADIPEX-P) 37.5 MG tablet, Take 37.5 mg by mouth every morning., Disp: , Rfl:    promethazine-dextromethorphan (PROMETHAZINE-DM) 6.25-15 MG/5ML syrup, Take 5 mLs by mouth at bedtime as needed for cough., Disp: 118 mL, Rfl: 0  Observations/Objective: Patient is well-developed, well-nourished in no acute distress.  Resting comfortably  at home.  Head is normocephalic, atraumatic.  No labored breathing.  Speech is clear and coherent with logical content.  Patient is alert and oriented at baseline.    Assessment and Plan:  Capers Hagmann Porada in today with chief complaint of Results   1. Syphilis (acquired) Needs penicillin injection- needs to get as soon as  possible Please do not delay treatment   The above assessment and management plan was discussed with the patient. The patient verbalized understanding of and has agreed to the management plan. Patient is aware to call the clinic if symptoms persist or worsen. Patient is aware when to return to the clinic for a follow-up visit. Patient educated on when it is appropriate to go to the emergency department.   [name], FNP    Follow Up Instructions: I discussed the assessment and treatment plan with the patient. The patient was provided an opportunity to ask questions and all were answered. The patient agreed with the plan and demonstrated an understanding of the instructions.  A copy of instructions were sent to the patient via MyChart.  The patient was advised to call back or seek an in-person evaluation if the symptoms worsen or if the condition fails to improve as  anticipated.  Time:  I spent 15 minutes with the patient via telehealth technology discussing the above problems/concerns.    [name], FNP

## 2023-05-03 NOTE — ED Triage Notes (Signed)
Pt reports that he was seen at [facility] for STD testing. Reports he saw his test results today and syphilis was positive. He had a virtual visit today regarding his positive test results and was referred to come here for treatment.

## 2023-05-03 NOTE — ED Provider Notes (Signed)
[facility]    CSN: [id number] Arrival date & time: 05/03/23  1611      History   Chief Complaint Chief Complaint  Patient presents with   SEXUALLY TRANSMITTED DISEASE    HPI Matthew Schroeder is a 27 y.o. male.   Patient with history of HIV presents to urgent care for evaluation after positive syphilis test with his infectious disease provider.  He was seen by family medicine via video visit this morning to discuss blood work from recent infectious disease visit who recommended in person visit for penicillin injection to treat active syphilis infection.  Patient has had syphilis in the past approximately 1.5 years ago in December 2022.  He has had 2 nonreactive RPR's since then but had a reactive RPR with a one-to-one ratio at recent infectious disease visit.  Denies recent known exposures to STDs.  Denies penile discharge, itching, and odor.  No rashes.  Denies urinary symptoms.  No fever/chills.     Past Medical History:  Diagnosis Date   HIV disease [facility])     Patient Active Problem List   Diagnosis Date Noted   Healthcare maintenance 11/20/2018   Mixed bipolar I disorder (HCC) 07/16/2016   Attention deficit hyperactivity disorder (ADHD) 07/16/2016   HIV (human immunodeficiency virus infection) (HCC) 06/08/2016    Past Surgical History:  Procedure Laterality Date   NO PAST SURGERIES         Home Medications    Prior to Admission medications   Medication Sig Start Date End Date Taking? Authorizing Provider  benzonatate (TESSALON) 100 MG capsule Take 1 capsule (100 mg total) by mouth every 8 (eight) hours. 01/02/23   Carlisle Beers, FNP  cabotegravir & rilpivirine ER (CABENUVA) 600 & 900 MG/3ML injection Inject 1 kit into the muscle every 30 (thirty) days. 07/16/22   Jennette Kettle, RPH-CPP  cabotegravir & rilpivirine ER (CABENUVA) 600 & 900 MG/3ML injection Inject 1 kit into the muscle every 2 (two) months. 07/16/22   Jennette Kettle, RPH-CPP   HYDROcodone-acetaminophen (NORCO/VICODIN) 5-325 MG tablet Take 1 tablet by mouth every 4 (four) hours as needed for moderate pain. 07/31/22 07/31/23  Elson Areas, PA-C  hydrocortisone (PROCTOZONE-HC) 2.5 % rectal cream Place 1 application rectally 2 (two) times daily. Patient not taking: Reported on 07/08/2022 02/23/22   Guy Sandifer L, PA  phentermine (ADIPEX-P) 37.5 MG tablet Take 37.5 mg by mouth every morning. 04/30/22   [provider]  promethazine-dextromethorphan (PROMETHAZINE-DM) 6.25-15 MG/5ML syrup Take 5 mLs by mouth at bedtime as needed for cough. 01/02/23   Carlisle Beers, FNP    Family History Family History  Problem Relation Age of Onset   Colon cancer Neg Hx    Esophageal cancer Neg Hx    Stomach cancer Neg Hx     Social History Social History   Tobacco Use   Smoking status: Never   Smokeless tobacco: Never  Vaping Use   Vaping Use: Never used  Substance Use Topics   Alcohol use: No   Drug use: Yes    Types: Marijuana     Allergies   Patient has no known allergies.   Review of Systems Review of Systems Per HPI  Physical Exam Triage Vital Signs ED Triage Vitals  Enc Vitals Group     BP 05/03/23 1654 139/87     Pulse Rate 05/03/23 1654 68     Resp 05/03/23 1654 16     Temp 05/03/23 1654 97.8 F (36.6  C)     Temp Source 05/03/23 1654 Oral     SpO2 05/03/23 1654 98 %     Weight --      Height --      Head Circumference --      Peak Flow --      Pain Score 05/03/23 1653 0     Pain Loc --      Pain Edu? --      Excl. in GC? --    No data found.  Updated Vital Signs BP 139/87 (BP Location: Left Arm)   Pulse 68   Temp 97.8 F (36.6 C) (Oral)   Resp 16   SpO2 98%   Visual Acuity Right Eye Distance:   Left Eye Distance:   Bilateral Distance:    Right Eye Near:   Left Eye Near:    Bilateral Near:     Physical Exam Vitals and nursing note reviewed.  Constitutional:      Appearance: He is not ill-appearing or  toxic-appearing.  HENT:     Head: Normocephalic and atraumatic.     Right Ear: Hearing and external ear normal.     Left Ear: Hearing and external ear normal.     Nose: Nose normal.     Mouth/Throat:     Lips: Pink.  Eyes:     General: Lids are normal. Vision grossly intact. Gaze aligned appropriately.     Extraocular Movements: Extraocular movements intact.     Conjunctiva/sclera: Conjunctivae normal.  Pulmonary:     Effort: Pulmonary effort is normal.  Genitourinary:    Comments: Deferred. Musculoskeletal:     Cervical back: Neck supple.  Skin:    General: Skin is warm and dry.     Capillary Refill: Capillary refill takes less than 2 seconds.     Findings: No rash.  Neurological:     General: No focal deficit present.     Mental Status: He is alert and oriented to person, place, and time. Mental status is at baseline.     Cranial Nerves: No dysarthria or facial asymmetry.  Psychiatric:        Mood and Affect: Mood normal.        Speech: Speech normal.        Behavior: Behavior normal.        Thought Content: Thought content normal.        Judgment: Judgment normal.      UC Treatments / Results  Labs (all labs ordered are listed, but only abnormal results are displayed) Labs Reviewed - No data to display  EKG   Radiology No results found.  Procedures Procedures (including critical care time)  Medications Ordered in UC Medications  penicillin g benzathine (BICILLIN LA) 1200000 UNIT/2ML injection 2.4 Million Units (has no administration in time range)    Initial Impression / Assessment and Plan / UC Course  I have reviewed the triage vital signs and the nursing notes.  Pertinent labs & imaging results that were available during my care of the patient were reviewed by me and considered in my medical decision making (see chart for details).   Syphilis (acquired) Penicillin 2,400,000 units IM given in clinic for active syphilis infection.  Safe sex precautions  discussed.  Advised to return to clinic in 6 months and 12 months for retesting.  No intercourse for 7 days while undergoing treatment for syphilis.  Advised to notify recent sexual partners of positive syphilis test result with active infection.  Discussed  physical exam and available lab work findings in clinic with patient.  Counseled patient regarding appropriate use of medications and potential side effects for all medications recommended or prescribed today. Discussed red flag signs and symptoms of worsening condition,when to call the PCP office, return to urgent care, and when to seek higher level of care in the emergency department. Patient verbalizes understanding and agreement with plan. All questions answered. Patient discharged in stable condition.   Final Clinical Impressions(s) / UC Diagnoses   Final diagnoses:  Syphilis (acquired)     Discharge Instructions      We gave you a penicillin injection today to treat your syphilis infection.  Do not have any intercourse for 7 days while undergoing treatment.  Please use condoms in the future to prevent spread of STDs.  Return to urgent care as needed.     ED Prescriptions   None    PDMP not reviewed this encounter.   Carlisle Beers, [place] 05/03/23 [phone]

## 2023-06-02 ENCOUNTER — Other Ambulatory Visit (HOSPITAL_COMMUNITY): Payer: Self-pay

## 2023-06-05 ENCOUNTER — Other Ambulatory Visit (HOSPITAL_COMMUNITY): Payer: Self-pay

## 2023-06-10 ENCOUNTER — Other Ambulatory Visit (HOSPITAL_COMMUNITY): Payer: Self-pay

## 2023-06-12 ENCOUNTER — Telehealth: Payer: Self-pay

## 2023-06-12 NOTE — Telephone Encounter (Signed)
[facility] Patient Advocate Encounter  Patient's medication Renaldo Harrison) have been couriered to [facility] from [facility] and will be administered on the patient next office visit on 06/17/23.  Clearance Coots , CPhT Specialty Pharmacy Patient [facility] for Infectious Disease Phone: [phone] Fax:  [phone]

## 2023-06-17 ENCOUNTER — Ambulatory Visit: Payer: Medicare HMO | Admitting: Internal Medicine

## 2023-06-18 ENCOUNTER — Encounter: Payer: Self-pay | Admitting: Pharmacist

## 2023-08-11 ENCOUNTER — Other Ambulatory Visit: Payer: Self-pay | Admitting: Pharmacist

## 2023-08-11 ENCOUNTER — Other Ambulatory Visit: Payer: Self-pay

## 2023-08-11 ENCOUNTER — Other Ambulatory Visit (HOSPITAL_COMMUNITY): Payer: Self-pay

## 2023-08-11 DIAGNOSIS — B20 Human immunodeficiency virus [HIV] disease: Secondary | ICD-10-CM

## 2023-08-11 MED ORDER — CABOTEGRAVIR & RILPIVIRINE ER 600 & 900 MG/3ML IM SUER
1.0000 | INTRAMUSCULAR | 5 refills | Status: DC
  Filled 2023-08-11 (×2): qty 6, 60d supply, fill #0
  Filled 2023-10-01: qty 6, 60d supply, fill #1

## 2023-08-14 ENCOUNTER — Telehealth: Payer: Self-pay

## 2023-08-14 NOTE — Telephone Encounter (Signed)
[facility] Patient Advocate Encounter  Patient's medication Renaldo Harrison) have been couriered to [facility] from [facility] and will be administered on the patient next office visit on 08/18/23.  Clearance Coots , CPhT Specialty Pharmacy Patient [facility] for Infectious Disease Phone: [phone] Fax:  [phone]

## 2023-08-18 ENCOUNTER — Other Ambulatory Visit: Payer: Self-pay

## 2023-08-18 ENCOUNTER — Ambulatory Visit (INDEPENDENT_AMBULATORY_CARE_PROVIDER_SITE_OTHER): Payer: Medicare HMO | Admitting: Pharmacist

## 2023-08-18 ENCOUNTER — Other Ambulatory Visit (HOSPITAL_COMMUNITY)
Admission: RE | Admit: 2023-08-18 | Discharge: 2023-08-18 | Disposition: A | Discharge status: Home / Self Care | Payer: Medicare HMO | Source: Ambulatory Visit | Attending: Internal Medicine | Admitting: Internal Medicine

## 2023-08-18 DIAGNOSIS — Z23 Encounter for immunization: Secondary | ICD-10-CM | POA: Diagnosis not present

## 2023-08-18 DIAGNOSIS — Z113 Encounter for screening for infections with a predominantly sexual mode of transmission: Secondary | ICD-10-CM | POA: Diagnosis present

## 2023-08-18 DIAGNOSIS — B20 Human immunodeficiency virus [HIV] disease: Secondary | ICD-10-CM

## 2023-08-18 MED ORDER — CABOTEGRAVIR & RILPIVIRINE ER 600 & 900 MG/3ML IM SUER
1.0000 | Freq: Once | INTRAMUSCULAR | Status: AC
  Administered 2023-08-18: 1 via INTRAMUSCULAR

## 2023-08-18 NOTE — Progress Notes (Signed)
HPI: Matthew Schroeder is a 27 y.o. male who presents to the [facility] pharmacy clinic for [place] administration.  Patient Active Problem List   Diagnosis Date Noted   Healthcare maintenance 11/20/2018   Mixed bipolar I disorder (HCC) 07/16/2016   Attention deficit hyperactivity disorder (ADHD) 07/16/2016   HIV (human immunodeficiency virus infection) (HCC) 06/08/2016    Patient's Medications  New Prescriptions   No medications on file  Previous Medications   BENZONATATE (TESSALON) 100 MG CAPSULE    Take 1 capsule (100 mg total) by mouth every 8 (eight) hours.   CABOTEGRAVIR & RILPIVIRINE ER (CABENUVA) 600 & 900 MG/3ML INJECTION    Inject 1 kit into the muscle every 30 (thirty) days.   CABOTEGRAVIR & RILPIVIRINE ER (CABENUVA) 600 & 900 MG/3ML INJECTION    Inject 1 kit into the muscle every 2 (two) months.   HYDROCORTISONE (PROCTOZONE-HC) 2.5 % RECTAL CREAM    Place 1 application rectally 2 (two) times daily.   PHENTERMINE (ADIPEX-P) 37.5 MG TABLET    Take 37.5 mg by mouth every morning.   PROMETHAZINE-DEXTROMETHORPHAN (PROMETHAZINE-DM) 6.25-15 MG/5ML SYRUP    Take 5 mLs by mouth at bedtime as needed for cough.  Modified Medications   No medications on file  Discontinued Medications   No medications on file    Allergies: No Known Allergies  Labs: Lab Results  Component Value Date   HIV1RNAQUANT Not Detected 04/30/2023   HIV1RNAQUANT Not Detected 02/19/2023   HIV1RNAQUANT Not Detected 10/29/2022   CD4TABS 724 11/13/2021   CD4TABS 1,278 03/21/2021   CD4TABS 984 09/12/2020    RPR and STI Lab Results  Component Value Date   LABRPR REACTIVE (A) 04/30/2023   LABRPR NON-REACTIVE 10/29/2022   LABRPR NON-REACTIVE 07/23/2022   LABRPR REACTIVE (A) 11/13/2021   LABRPR REACTIVE (A) 03/21/2021   RPRTITER 1:1 (H) 04/30/2023   RPRTITER 1:1 (H) 11/13/2021   RPRTITER 1:1 (H) 03/21/2021   RPRTITER 1:2 (H) 09/12/2020   RPRTITER 1:4 (H) 05/10/2020    STI Results GC CT  04/30/2023   2:09 PM Negative  Negative   10/29/2022 10:24 AM Negative    Negative  Negative    Negative   09/12/2020  3:28 PM Negative  Negative   06/10/2019 12:00 AM Negative  Negative   05/05/2018 12:00 AM **POSITIVE**  Negative   09/02/2017 12:00 AM Negative  Negative     Hepatitis B Lab Results  Component Value Date   HEPBSAB REACTIVE (A) 07/23/2022   HEPBSAG NON-REACTIVE 07/23/2022   Hepatitis C Lab Results  Component Value Date   HEPCAB NON-REACTIVE 07/23/2022   Hepatitis A Lab Results  Component Value Date   HAV REACTIVE (A) 07/23/2022   Lipids: Lab Results  Component Value Date   CHOL 206 (H) 03/21/2021   TRIG 153 (H) 03/21/2021   HDL 47 03/21/2021   CHOLHDL 4.4 03/21/2021   LDLCALC 132 (H) 03/21/2021    TARGET DATE: The 29th  Assessment: Matthew Schroeder presents today for his maintenance Cabenuva injections. Past injections were tolerated well without issues. Last HIV RNA was undetectable in June. No new exposures to any STIs but requesting full STI testing today. Accepts annual flu vaccine and politely declines COVID vaccine today.  Administered cabotegravir 600mg /46mL in left upper outer quadrant of the gluteal muscle. Administered rilpivirine 900 mg/33mL in the right upper outer quadrant of the gluteal muscle. No issues with injections. He will follow up in 2 months for next set of injections.  Plan: - Cabenuva injections administered -  HIV RNA, RPR, and urine/rectal/pharyngeal cytologies for GC/chlamydia today - Flu vaccine today - Next injections scheduled for 10/21/23 with Dr. Drue Second and 12/17/23 with me - Call with any issues or questions  Knoxx Boeding L. Maanasa Aderhold, PharmD, BCIDP, AAHIVP, CPP Clinical Pharmacist Practitioner Infectious Diseases Clinical Pharmacist [facility]

## 2023-08-20 ENCOUNTER — Other Ambulatory Visit (HOSPITAL_COMMUNITY): Payer: Self-pay

## 2023-08-20 ENCOUNTER — Encounter: Payer: Self-pay | Admitting: Pharmacist

## 2023-08-20 ENCOUNTER — Telehealth: Payer: Self-pay | Admitting: Pharmacist

## 2023-08-20 ENCOUNTER — Other Ambulatory Visit: Payer: Self-pay

## 2023-08-20 ENCOUNTER — Ambulatory Visit (INDEPENDENT_AMBULATORY_CARE_PROVIDER_SITE_OTHER): Payer: Medicare HMO

## 2023-08-20 DIAGNOSIS — A749 Chlamydial infection, unspecified: Secondary | ICD-10-CM

## 2023-08-20 DIAGNOSIS — A549 Gonococcal infection, unspecified: Secondary | ICD-10-CM | POA: Diagnosis not present

## 2023-08-20 LAB — URINE CYTOLOGY ANCILLARY ONLY
Chlamydia: NEGATIVE
Comment: NEGATIVE
Comment: NORMAL
Neisseria Gonorrhea: NEGATIVE

## 2023-08-20 LAB — CYTOLOGY, (ORAL, ANAL, URETHRAL) ANCILLARY ONLY
Chlamydia: NEGATIVE
Chlamydia: POSITIVE — AB
Comment: NEGATIVE
Comment: NEGATIVE
Comment: NORMAL
Comment: NORMAL
Neisseria Gonorrhea: NEGATIVE
Neisseria Gonorrhea: POSITIVE — AB

## 2023-08-20 LAB — HIV-1 RNA QUANT-NO REFLEX-BLD
HIV 1 RNA Quant: NOT DETECTED Copies/mL
HIV-1 RNA Quant, Log: NOT DETECTED Log cps/mL

## 2023-08-20 LAB — RPR: RPR Ser Ql: NONREACTIVE

## 2023-08-20 MED ORDER — DOXYCYCLINE HYCLATE 100 MG PO TABS
100.0000 mg | ORAL_TABLET | Freq: Two times a day (BID) | ORAL | 0 refills | Status: DC
Start: 2023-08-20 — End: 2023-10-02
  Filled 2023-08-20: qty 14, 7d supply, fill #0

## 2023-08-20 MED ORDER — CEFTRIAXONE SODIUM 500 MG IJ SOLR
500.0000 mg | Freq: Once | INTRAMUSCULAR | Status: AC
Start: 2023-08-20 — End: 2023-08-20
  Administered 2023-08-20: 500 mg via INTRAMUSCULAR

## 2023-08-20 NOTE — Telephone Encounter (Signed)
Thanks Aundra Millet!

## 2023-08-20 NOTE — Addendum Note (Signed)
Addended by: Linna Hoff D on: 08/20/2023 02:51 PM   Modules accepted: Orders

## 2023-08-20 NOTE — Telephone Encounter (Signed)
Patient's rectal screen returned positive for gonorrhea and chlamydia. He will come in today for treatment. He will need ceftriaxone 500 mg IM x 1 for gonorrhea, and I will send in doxycycline 100 mg PO BID x 7 days for the chlamydia. Abstain from sexual activity for 7 days after treatment and advise all partners to get tested/treated.   Daequan Kozma L. Daleyssa Loiselle, PharmD, BCIDP, AAHIVP, CPP Clinical Pharmacist Practitioner Infectious Diseases Clinical Pharmacist [facility] 08/20/2023, 2:20 PM

## 2023-08-20 NOTE — Progress Notes (Signed)
Nurse Visit  Matthew Schroeder 05/23/1996   No Known Allergies   Reviewed allergies with patient.   Medications administered: ceftriaxone 500 mg IM   Immunizations administered: none  Patient tolerated well.    Advised patient no sex until treatment is completed plus an additional 7 days and instructed to notify sexual partners for testing and treatment. Patient verbalized understanding and has no further questions.   Sandie Ano, RN

## 2023-10-01 ENCOUNTER — Other Ambulatory Visit: Payer: Self-pay

## 2023-10-01 ENCOUNTER — Other Ambulatory Visit (HOSPITAL_COMMUNITY): Payer: Self-pay

## 2023-10-01 NOTE — Progress Notes (Signed)
Specialty Pharmacy Refill Coordination Note  Matthew Schroeder is a 27 y.o. male assessed today regarding refills of clinic administered specialty medication(s) Cabotegravir & Rilpivirine   Clinic requested Courier to Provider Office   Delivery date: 10/14/23   Verified address: [facility] [address] [place] [address]   Medication will be filled on 10/13/23.

## 2023-10-02 ENCOUNTER — Ambulatory Visit (HOSPITAL_COMMUNITY): Payer: Medicare HMO

## 2023-10-02 ENCOUNTER — Encounter (HOSPITAL_COMMUNITY): Payer: Self-pay

## 2023-10-02 ENCOUNTER — Ambulatory Visit (HOSPITAL_COMMUNITY)
Admission: EM | Admit: 2023-10-02 | Discharge: 2023-10-02 | Disposition: A | Discharge status: Home / Self Care | Payer: Medicare HMO | Attending: Family Medicine | Admitting: Family Medicine

## 2023-10-02 DIAGNOSIS — M79641 Pain in right hand: Secondary | ICD-10-CM

## 2023-10-02 MED ORDER — IBUPROFEN 600 MG PO TABS: 600.0000 mg | ORAL_TABLET | Freq: Three times a day (TID) | ORAL | 0 refills | Status: DC | PRN
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
## 2023-10-02 NOTE — Discharge Instructions (Addendum)
Your x-ray does not show any abnormality.  I cannot see any foreign body on the x-rays.  Take ibuprofen 600 mg--1 tab every 8 hours as needed for pain.   Please follow-up with your primary care about this issue
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
## 2023-10-03 ENCOUNTER — Other Ambulatory Visit: Payer: Self-pay

## 2023-10-03 ENCOUNTER — Other Ambulatory Visit (HOSPITAL_COMMUNITY): Payer: Self-pay

## 2023-10-03 ENCOUNTER — Other Ambulatory Visit: Payer: Self-pay | Admitting: Pharmacist

## 2023-10-03 DIAGNOSIS — Z21 Asymptomatic human immunodeficiency virus [HIV] infection status: Secondary | ICD-10-CM

## 2023-10-03 MED ORDER — CABOTEGRAVIR & RILPIVIRINE ER 600 & 900 MG/3ML IM SUER
1.0000 | INTRAMUSCULAR | 5 refills | Status: DC
  Filled 2023-10-03: qty 6, 60d supply, fill #0
  Filled 2023-11-28: qty 6, 60d supply, fill #1
  Filled 2024-02-02: qty 6, 60d supply, fill #2
  Filled 2024-03-29: qty 6, 60d supply, fill #3
  Filled 2024-06-10: qty 6, 60d supply, fill #4
  Filled 2024-08-09: qty 6, 60d supply, fill #5

## 2023-10-03 NOTE — Progress Notes (Signed)
ED provider discontinued patient's Cabenuva prescription - no reason given in notes. Resending prescription as patient continues on [place] through [facility].   Margarite Gouge, PharmD, CPP, BCIDP, AAHIVP Clinical Pharmacist Practitioner Infectious Diseases Clinical Pharmacist [facility] for Infectious Disease

## 2023-10-03 NOTE — Progress Notes (Signed)
Specialty Pharmacy Refill Coordination Note  Matthew Schroeder is a 27 y.o. male assessed today regarding refills of clinic administered specialty medication(s) Cabotegravir & Rilpivirine   Clinic requested Courier to Provider Office   Delivery date: 10/08/23   Verified address: [facility] [address] [place] [address]   Medication will be filled on 10/07/23.

## 2023-10-07 ENCOUNTER — Encounter: Payer: Self-pay | Admitting: Internal Medicine

## 2023-10-07 ENCOUNTER — Other Ambulatory Visit: Payer: Self-pay

## 2023-10-08 ENCOUNTER — Telehealth: Payer: Self-pay

## 2023-10-08 NOTE — Telephone Encounter (Signed)
[facility] Patient Advocate Encounter  Patient's medications CABENUVA have been couriered to [facility] from [facility] and will be administered at the patients appointment on 10/21/23.  Kae Heller, CPhT Specialty Pharmacy Patient [facility] for Infectious Disease Phone: [phone] Fax:  [phone]

## 2023-10-21 ENCOUNTER — Ambulatory Visit: Payer: Medicare HMO | Admitting: Internal Medicine

## 2023-10-29 ENCOUNTER — Telehealth: Payer: Self-pay

## 2023-10-29 ENCOUNTER — Ambulatory Visit: Payer: Medicare HMO | Admitting: Internal Medicine

## 2023-10-29 NOTE — Telephone Encounter (Signed)
Called Matthew Schroeder to see if he was on his way to his appointment for [facility], no answer. Left HIPAA compliant voicemail requesting callback.   Target date is the 29th, he'll need to receive injection by 12/6 to remain within his window.   Sandie Ano, RN

## 2023-11-04 ENCOUNTER — Telehealth: Payer: Self-pay

## 2023-11-04 NOTE — Telephone Encounter (Signed)
Spoke with patient, scheduled follow up at next available visit with Cassie (11/11/23). Will plan to have him see Dr. Drue Second at subsequent injection if possible. Discussed with Tammy the importance of adherence. He reports that he was unaware that the appointment he missed was an injection appointment, just a follow up with Dr. Drue Second. I counseled him that essentially every appointment with our clinic will be an injection visit unless he has another reason for visit, such as known STI exposure/treatment. He expressed understanding.

## 2023-11-11 ENCOUNTER — Ambulatory Visit: Payer: Medicare HMO | Admitting: Pharmacist

## 2023-11-11 ENCOUNTER — Other Ambulatory Visit (HOSPITAL_COMMUNITY)
Admission: RE | Admit: 2023-11-11 | Discharge: 2023-11-11 | Disposition: A | Discharge status: Home / Self Care | Payer: Medicare HMO | Source: Ambulatory Visit | Attending: Internal Medicine | Admitting: Internal Medicine

## 2023-11-11 ENCOUNTER — Other Ambulatory Visit: Payer: Self-pay

## 2023-11-11 DIAGNOSIS — Z113 Encounter for screening for infections with a predominantly sexual mode of transmission: Secondary | ICD-10-CM | POA: Insufficient documentation

## 2023-11-11 DIAGNOSIS — B20 Human immunodeficiency virus [HIV] disease: Secondary | ICD-10-CM

## 2023-11-11 MED ORDER — CABOTEGRAVIR & RILPIVIRINE ER 600 & 900 MG/3ML IM SUER
1.0000 | Freq: Once | INTRAMUSCULAR | Status: AC
  Administered 2023-11-11: 1 via INTRAMUSCULAR

## 2023-11-11 NOTE — Progress Notes (Unsigned)
HPI: Matthew Schroeder is a 27 y.o. male who presents to the [facility] pharmacy clinic for [place] administration.  Patient Active Problem List   Diagnosis Date Noted   Healthcare maintenance 11/20/2018   Mixed bipolar I disorder (HCC) 07/16/2016   Attention deficit hyperactivity disorder (ADHD) 07/16/2016   HIV (human immunodeficiency virus infection) (HCC) 06/08/2016    Patient's Medications  New Prescriptions   No medications on file  Previous Medications   CABOTEGRAVIR & RILPIVIRINE ER (CABENUVA) 600 & 900 MG/3ML INJECTION    Inject 1 kit into the muscle every 2 (two) months.   IBUPROFEN (ADVIL) 600 MG TABLET    Take 1 tablet (600 mg total) by mouth every 8 (eight) hours as needed (pain).  Modified Medications   No medications on file  Discontinued Medications   No medications on file    Allergies: No Known Allergies  Labs: Lab Results  Component Value Date   HIV1RNAQUANT Not Detected 08/18/2023   HIV1RNAQUANT Not Detected 04/30/2023   HIV1RNAQUANT Not Detected 02/19/2023   CD4TABS 724 11/13/2021   CD4TABS 1,278 03/21/2021   CD4TABS 984 09/12/2020    RPR and STI Lab Results  Component Value Date   LABRPR NON-REACTIVE 08/18/2023   LABRPR REACTIVE (A) 04/30/2023   LABRPR NON-REACTIVE 10/29/2022   LABRPR NON-REACTIVE 07/23/2022   LABRPR REACTIVE (A) 11/13/2021   RPRTITER 1:1 (H) 04/30/2023   RPRTITER 1:1 (H) 11/13/2021   RPRTITER 1:1 (H) 03/21/2021   RPRTITER 1:2 (H) 09/12/2020   RPRTITER 1:4 (H) 05/10/2020    STI Results GC CT  08/18/2023  4:00 PM Positive    Negative  Positive    Negative   08/18/2023  3:52 PM Negative  Negative   04/30/2023  2:09 PM Negative  Negative   10/29/2022 10:24 AM Negative    Negative  Negative    Negative   09/12/2020  3:28 PM Negative  Negative   06/10/2019 12:00 AM Negative  Negative   05/05/2018 12:00 AM **POSITIVE**  Negative   09/02/2017 12:00 AM Negative  Negative     Hepatitis B Lab Results  Component Value Date    HEPBSAB REACTIVE (A) 07/23/2022   HEPBSAG NON-REACTIVE 07/23/2022   Hepatitis C Lab Results  Component Value Date   HEPCAB NON-REACTIVE 07/23/2022   Hepatitis A Lab Results  Component Value Date   HAV REACTIVE (A) 07/23/2022   Lipids: Lab Results  Component Value Date   CHOL 206 (H) 03/21/2021   TRIG 153 (H) 03/21/2021   HDL 47 03/21/2021   CHOLHDL 4.4 03/21/2021   LDLCALC 132 (H) 03/21/2021    TARGET DATE: The 29th of the month (out of window)  Assessment: Matthew Schroeder presents today for his maintenance Cabenuva injections. Unfortunately we are out of window today, as he missed his follow up with Dr. Drue Second. Discussed with Matthew Schroeder again the importance of adherence and risk of resistance on Cabenuva. We will keep him on his current dosing schedule, as it is less than 3 months since last maintenance dose. Will update his labs, including RNA, CD4, lipids, CMP, and CBC today.  Matthew Schroeder is due for the following immunizations: COVID, PCV20 (>5 years) today. He politely declines these.  Administered cabotegravir 600mg /67mL in left upper outer quadrant of the gluteal muscle. Administered rilpivirine 900 mg/4mL in the right upper outer quadrant of the gluteal muscle. No issues with injections. Matthew Schroeder will follow up in 2 months for next set of injections.  Plan: - Cabenuva injections administered - HIV RNA, CD4, lipids,  CMP, and CBC today - Next injections scheduled for *** with Dr. Drue Second and *** with Cassie - Call with any issues or questions  Lora Paula, PharmD PGY-2 Infectious Diseases Pharmacy Resident [facility] for Infectious Disease

## 2023-11-12 LAB — CYTOLOGY, (ORAL, ANAL, URETHRAL) ANCILLARY ONLY
Chlamydia: NEGATIVE
Chlamydia: NEGATIVE
Comment: NEGATIVE
Comment: NEGATIVE
Comment: NORMAL
Comment: NORMAL
Neisseria Gonorrhea: NEGATIVE
Neisseria Gonorrhea: NEGATIVE

## 2023-11-12 LAB — T-HELPER CELLS (CD4) COUNT (NOT AT ARMC)
CD4 % Helper T Cell: 44 % (ref 33–65)
CD4 T Cell Abs: 1254 /uL (ref 400–1790)

## 2023-11-12 LAB — URINE CYTOLOGY ANCILLARY ONLY
Chlamydia: NEGATIVE
Comment: NEGATIVE
Comment: NORMAL
Neisseria Gonorrhea: NEGATIVE

## 2023-11-13 LAB — COMPREHENSIVE METABOLIC PANEL
AG Ratio: 1.7 (calc) (ref 1.0–2.5)
ALT: 13 U/L (ref 9–46)
AST: 16 U/L (ref 10–40)
Albumin: 4.7 g/dL (ref 3.6–5.1)
Alkaline phosphatase (APISO): 65 U/L (ref 36–130)
BUN: 10 mg/dL (ref 7–25)
CO2: 29 mmol/L (ref 20–32)
Calcium: 9.6 mg/dL (ref 8.6–10.3)
Chloride: 105 mmol/L (ref 98–110)
Creat: 1.13 mg/dL (ref 0.60–1.24)
Globulin: 2.8 g/dL (ref 1.9–3.7)
Glucose, Bld: 97 mg/dL (ref 65–99)
Potassium: 3.7 mmol/L (ref 3.5–5.3)
Sodium: 143 mmol/L (ref 135–146)
Total Bilirubin: 0.4 mg/dL (ref 0.2–1.2)
Total Protein: 7.5 g/dL (ref 6.1–8.1)

## 2023-11-13 LAB — CBC
HCT: 46.2 % (ref 38.5–50.0)
Hemoglobin: 15.4 g/dL (ref 13.2–17.1)
MCH: 30 pg (ref 27.0–33.0)
MCHC: 33.3 g/dL (ref 32.0–36.0)
MCV: 89.9 fL (ref 80.0–100.0)
MPV: 9.7 fL (ref 7.5–12.5)
Platelets: 303 10*3/uL (ref 140–400)
RBC: 5.14 10*6/uL (ref 4.20–5.80)
RDW: 12.8 % (ref 11.0–15.0)
WBC: 6.2 10*3/uL (ref 3.8–10.8)

## 2023-11-13 LAB — LIPID PANEL
Cholesterol: 170 mg/dL (ref <200)
HDL: 38 mg/dL — ABNORMAL LOW (ref >=40)
LDL Cholesterol (Calc): 105 mg/dL — ABNORMAL HIGH
Non-HDL Cholesterol (Calc): 132 mg/dL — ABNORMAL HIGH (ref <130)
Total CHOL/HDL Ratio: 4.5 (calc) (ref <5.0)
Triglycerides: 154 mg/dL — ABNORMAL HIGH (ref <150)

## 2023-11-13 LAB — HIV-1 RNA QUANT-NO REFLEX-BLD
HIV 1 RNA Quant: NOT DETECTED {copies}/mL
HIV-1 RNA Quant, Log: NOT DETECTED {Log}

## 2023-11-13 LAB — RPR: RPR Ser Ql: NONREACTIVE

## 2023-11-28 ENCOUNTER — Other Ambulatory Visit (HOSPITAL_COMMUNITY): Payer: Self-pay

## 2023-11-28 ENCOUNTER — Other Ambulatory Visit: Payer: Self-pay

## 2023-11-28 NOTE — Progress Notes (Signed)
 Specialty Pharmacy Refill Coordination Note  Matthew Schroeder is a 28 y.o. male assessed today regarding refills of clinic administered specialty medication(s) Cabotegravir  & Rilpivirine  (CABENUVA )   Clinic requested Courier to Provider Office   Delivery date: 12/16/23   Verified address: [address] [place] [address]   Medication will be filled on 12/15/23.

## 2023-12-15 ENCOUNTER — Other Ambulatory Visit: Payer: Self-pay

## 2023-12-16 ENCOUNTER — Telehealth: Payer: Self-pay

## 2023-12-16 NOTE — Telephone Encounter (Signed)
[facility] Patient Advocate Encounter  Patient's medications CABENUVA have been couriered to [facility] from [facility] and will be administered at the patients appointment on 12/17/23.  Kae Heller, CPhT Specialty Pharmacy Patient [facility] for Infectious Disease Phone: [phone] Fax:  [phone]

## 2023-12-17 ENCOUNTER — Ambulatory Visit: Payer: Self-pay | Admitting: Pharmacist

## 2023-12-22 ENCOUNTER — Ambulatory Visit (INDEPENDENT_AMBULATORY_CARE_PROVIDER_SITE_OTHER): Payer: Medicare HMO | Admitting: Internal Medicine

## 2023-12-22 ENCOUNTER — Encounter: Payer: Self-pay | Admitting: Internal Medicine

## 2023-12-22 ENCOUNTER — Other Ambulatory Visit: Payer: Self-pay

## 2023-12-22 VITALS — BP 150/90 | HR 81 | Temp 98.0°F | Resp 16 | Wt 186.4 lb

## 2023-12-22 DIAGNOSIS — B20 Human immunodeficiency virus [HIV] disease: Secondary | ICD-10-CM

## 2023-12-22 MED ORDER — CABOTEGRAVIR & RILPIVIRINE ER 600 & 900 MG/3ML IM SUER
1.0000 | Freq: Once | INTRAMUSCULAR | Status: AC
  Administered 2023-12-22: 1 via INTRAMUSCULAR

## 2023-12-22 NOTE — Progress Notes (Signed)
RFV: follow up for hiv disease  Patient ID: Matthew Schroeder, male   DOB: 07/16/1996, 28 y.o.   MRN: [id number]  HPI Ordell is a 28yo M with well controlled hiv disease, currently on cabenuva injection He reports doing wlel.  Outpatient Encounter Medications as of 12/22/2023  Medication Sig   cabotegravir & rilpivirine ER (CABENUVA) 600 & 900 MG/3ML injection Inject 1 kit into the muscle every 2 (two) months.   ibuprofen (ADVIL) 600 MG tablet Take 1 tablet (600 mg total) by mouth every 8 (eight) hours as needed (pain).   No facility-administered encounter medications on file as of 12/22/2023.     Patient Active Problem List   Diagnosis Date Noted   Healthcare maintenance 11/20/2018   Mixed bipolar I disorder (HCC) 07/16/2016   Attention deficit hyperactivity disorder (ADHD) 07/16/2016   HIV (human immunodeficiency virus infection) (HCC) 06/08/2016     Health Maintenance Due  Topic Date Due   Medicare Annual Wellness (AWV)  Never done   Pneumococcal Vaccine 26-49 Years old (3 of 3 - PPSV23 or PCV20) 04/30/2022   COVID-19 Vaccine (2 - Pfizer risk series) 11/19/2022     Review of Systems Review of Systems  Constitutional: Negative for fever, chills, diaphoresis, activity change, appetite change, fatigue and unexpected weight change.  HENT: Negative for congestion, sore throat, rhinorrhea, sneezing, trouble swallowing and sinus pressure.  Eyes: Negative for photophobia and visual disturbance.  Respiratory: Negative for cough, chest tightness, shortness of breath, wheezing and stridor.  Cardiovascular: Negative for chest pain, palpitations and leg swelling.  Gastrointestinal: Negative for nausea, vomiting, abdominal pain, diarrhea, constipation, blood in stool, abdominal distention and anal bleeding.  Genitourinary: Negative for dysuria, hematuria, flank pain and difficulty urinating.  Musculoskeletal: Negative for myalgias, back pain, joint swelling, arthralgias and gait  problem.  Skin: Negative for color change, pallor, rash and wound.  Neurological: Negative for dizziness, tremors, weakness and light-headedness.  Hematological: Negative for adenopathy. Does not bruise/bleed easily.  Psychiatric/Behavioral: Negative for behavioral problems, confusion, sleep disturbance, dysphoric mood, decreased concentration and agitation.   Physical Exam   BP (!) 150/90   Pulse 81   Temp 98 F (36.7 C) (Oral)   Resp 16   Wt 186 lb 6.4 oz (84.6 kg)   SpO2 97%   BMI 33.02 kg/m   Physical Exam  Constitutional: He is oriented to person, place, and time. He appears well-developed and well-nourished. No distress.  HENT:  Mouth/Throat: Oropharynx is clear and moist. No oropharyngeal exudate.  Cardiovascular: Normal rate, regular rhythm and normal heart sounds. Exam reveals no gallop and no friction rub.  No murmur heard.  Pulmonary/Chest: Effort normal and breath sounds normal. No respiratory distress. He has no wheezes.  Abdominal: Soft. Bowel sounds are normal. He exhibits no distension. There is no tenderness.  Lymphadenopathy:  He has no cervical adenopathy.  Neurological: He is alert and oriented to person, place, and time.  Skin: Skin is warm and dry. No rash noted. No erythema.  Psychiatric: He has a normal mood and affect. His behavior is normal.   Lab Results  Component Value Date   CD4TCELL 44 11/11/2023   Lab Results  Component Value Date   CD4TABS 1,254 11/11/2023   CD4TABS 724 11/13/2021   CD4TABS 1,278 03/21/2021   Lab Results  Component Value Date   HIV1RNAQUANT Not Detected 11/11/2023   Lab Results  Component Value Date   HEPBSAB REACTIVE (A) 07/23/2022   Lab Results  Component Value Date  LABRPR NON-REACTIVE 11/11/2023    CBC Lab Results  Component Value Date   WBC 6.2 11/11/2023   RBC 5.14 11/11/2023   HGB 15.4 11/11/2023   HCT 46.2 11/11/2023   PLT 303 11/11/2023   MCV 89.9 11/11/2023   MCH 30.0 11/11/2023   MCHC 33.3  11/11/2023   RDW 12.8 11/11/2023   LYMPHSABS 1,805 11/13/2021   MONOABS 0.7 05/05/2018   EOSABS 82 11/13/2021    BMET Lab Results  Component Value Date   NA 143 11/11/2023   K 3.7 11/11/2023   CL 105 11/11/2023   CO2 29 11/11/2023   GLUCOSE 97 11/11/2023   BUN 10 11/11/2023   CREATININE 1.13 11/11/2023   CALCIUM 9.6 11/11/2023   GFRNONAA >60 11/18/2021   GFRAA 106 03/21/2021      Assessment and Plan Hiv disease = Cabenuva today, then see back in 2 months HIV VL and cd 4 count today  Long term medication management = cr is stable on last labs   Hypertension = unclear if having white coat syndrome . Will continue to follow at next appt. Recommend to check outside of settings

## 2023-12-24 LAB — HIV-1 RNA QUANT-NO REFLEX-BLD
HIV 1 RNA Quant: <20 {copies}/mL — ABNORMAL HIGH
HIV-1 RNA Quant, Log: <1.3 {Log} — ABNORMAL HIGH

## 2024-02-02 ENCOUNTER — Other Ambulatory Visit (HOSPITAL_COMMUNITY): Payer: Self-pay

## 2024-02-02 ENCOUNTER — Other Ambulatory Visit: Payer: Self-pay

## 2024-02-02 NOTE — Progress Notes (Signed)
 Specialty Pharmacy Refill Coordination Note  Matthew Schroeder is a 28 y.o. male assessed today regarding refills of clinic administered specialty medication(s) Cabotegravir & Rilpivirine [facility])   Clinic requested Courier to Provider Office   Delivery date: 02/12/24   Verified address: [address] [place] [address]   Medication will be filled on 02/12/24.

## 2024-02-11 NOTE — Progress Notes (Signed)
 HPI: Matthew Schroeder is a 28 y.o. male who presents to the [facility] pharmacy clinic for [place] administration.  Patient Active Problem List   Diagnosis Date Noted   Healthcare maintenance 11/20/2018   Mixed bipolar I disorder (HCC) 07/16/2016   Attention deficit hyperactivity disorder (ADHD) 07/16/2016   HIV (human immunodeficiency virus infection) (HCC) 06/08/2016    Patient's Medications  New Prescriptions   No medications on file  Previous Medications   CABOTEGRAVIR & RILPIVIRINE ER (CABENUVA) 600 & 900 MG/3ML INJECTION    Inject 1 kit into the muscle every 2 (two) months.   IBUPROFEN (ADVIL) 600 MG TABLET    Take 1 tablet (600 mg total) by mouth every 8 (eight) hours as needed (pain).  Modified Medications   No medications on file  Discontinued Medications   No medications on file    Allergies: No Known Allergies  Labs: Lab Results  Component Value Date   HIV1RNAQUANT <20 (H) 12/22/2023   HIV1RNAQUANT Not Detected 11/11/2023   HIV1RNAQUANT Not Detected 08/18/2023   CD4TABS 1,254 11/11/2023   CD4TABS 724 11/13/2021   CD4TABS 1,278 03/21/2021    RPR and STI Lab Results  Component Value Date   LABRPR NON-REACTIVE 11/11/2023   LABRPR NON-REACTIVE 08/18/2023   LABRPR REACTIVE (A) 04/30/2023   LABRPR NON-REACTIVE 10/29/2022   LABRPR NON-REACTIVE 07/23/2022   RPRTITER 1:1 (H) 04/30/2023   RPRTITER 1:1 (H) 11/13/2021   RPRTITER 1:1 (H) 03/21/2021   RPRTITER 1:2 (H) 09/12/2020   RPRTITER 1:4 (H) 05/10/2020    STI Results GC CT  11/11/2023  2:30 PM Negative    Negative    Negative  Negative    Negative    Negative   08/18/2023  4:00 PM Positive    Negative  Positive    Negative   08/18/2023  3:52 PM Negative  Negative   04/30/2023  2:09 PM Negative  Negative   10/29/2022 10:24 AM Negative    Negative  Negative    Negative   09/12/2020  3:28 PM Negative  Negative   06/10/2019 12:00 AM Negative  Negative   05/05/2018 12:00 AM **POSITIVE**  Negative    09/02/2017 12:00 AM Negative  Negative     Hepatitis B Lab Results  Component Value Date   HEPBSAB REACTIVE (A) 07/23/2022   HEPBSAG NON-REACTIVE 07/23/2022   Hepatitis C Lab Results  Component Value Date   HEPCAB NON-REACTIVE 07/23/2022   Hepatitis A Lab Results  Component Value Date   HAV REACTIVE (A) 07/23/2022   Lipids: Lab Results  Component Value Date   CHOL 170 11/11/2023   TRIG 154 (H) 11/11/2023   HDL 38 (L) 11/11/2023   CHOLHDL 4.5 11/11/2023   LDLCALC 105 (H) 11/11/2023    TARGET DATE: The 29th  Assessment: Matthew Schroeder presents today for his maintenance Cabenuva injections. Past injections were tolerated well without issues. Last HIV RNA was undetectable in January. Doing well with no issues today. Declines need for STI testing.  Administered cabotegravir 600mg /49mL in left upper outer quadrant of the gluteal muscle. Administered rilpivirine 900 mg/35mL in the right upper outer quadrant of the gluteal muscle. No issues with injections. He will follow up in 2 months for next set of injections.  Plan: - Cabenuva injections administered - Next injections scheduled for 04/26/24 with me; 06/17/24 with Dr. Drue Second; 08/16/24 and 10/18/24 with me - Call with any issues or questions  Charli Liberatore L. Abbagayle Zaragoza, PharmD, BCIDP, AAHIVP, CPP Clinical Pharmacist Practitioner Infectious Diseases Clinical Pharmacist [facility]  for Infectious Disease

## 2024-02-12 ENCOUNTER — Telehealth: Payer: Self-pay

## 2024-02-12 NOTE — Telephone Encounter (Signed)
 [facility] Patient Advocate Encounter  Patient's medications CABENUVA have been couriered to [facility] from [facility] and will be administered at the patients appointment on 02/16/24.  Kae Heller, CPhT Specialty Pharmacy Patient [facility] for Infectious Disease Phone: [phone] Fax:  [phone]

## 2024-02-16 ENCOUNTER — Other Ambulatory Visit: Payer: Self-pay

## 2024-02-16 ENCOUNTER — Ambulatory Visit: Payer: Medicare HMO | Admitting: Pharmacist

## 2024-02-16 DIAGNOSIS — B20 Human immunodeficiency virus [HIV] disease: Secondary | ICD-10-CM | POA: Diagnosis not present

## 2024-02-16 DIAGNOSIS — Z113 Encounter for screening for infections with a predominantly sexual mode of transmission: Secondary | ICD-10-CM

## 2024-02-16 MED ORDER — CABOTEGRAVIR & RILPIVIRINE ER 600 & 900 MG/3ML IM SUER
1.0000 | Freq: Once | INTRAMUSCULAR | Status: AC
  Administered 2024-02-16: 1 via INTRAMUSCULAR

## 2024-03-29 ENCOUNTER — Other Ambulatory Visit: Payer: Self-pay

## 2024-03-29 ENCOUNTER — Other Ambulatory Visit (HOSPITAL_COMMUNITY): Payer: Self-pay

## 2024-03-29 NOTE — Progress Notes (Signed)
 Specialty Pharmacy Refill Coordination Note  Matthew Schroeder is a 28 y.o. male assessed today regarding refills of clinic administered specialty medication(s) Cabotegravir  & Rilpivirine  (CABENUVA )   Clinic requested Courier to Provider Office   Delivery date: 04/19/24   Verified address: [address] [place] [address]   Medication will be filled on 04/16/24.

## 2024-04-15 ENCOUNTER — Ambulatory Visit: Admitting: Pharmacist

## 2024-04-16 ENCOUNTER — Other Ambulatory Visit: Payer: Self-pay

## 2024-04-17 ENCOUNTER — Ambulatory Visit (INDEPENDENT_AMBULATORY_CARE_PROVIDER_SITE_OTHER)

## 2024-04-17 ENCOUNTER — Encounter (HOSPITAL_COMMUNITY): Payer: Self-pay

## 2024-04-17 ENCOUNTER — Ambulatory Visit (HOSPITAL_COMMUNITY)
Admission: RE | Admit: 2024-04-17 | Discharge: 2024-04-17 | Disposition: A | Discharge status: Home / Self Care | Source: Ambulatory Visit | Attending: Family Medicine | Admitting: Family Medicine

## 2024-04-17 VITALS — BP 147/100 | HR 74 | Temp 98.0°F | Resp 16

## 2024-04-17 DIAGNOSIS — R051 Acute cough: Secondary | ICD-10-CM

## 2024-04-17 MED ORDER — BENZONATATE 100 MG PO CAPS: 100.0000 mg | ORAL_CAPSULE | Freq: Three times a day (TID) | ORAL | 0 refills | Status: AC | PRN

## 2024-04-17 MED ORDER — DOXYCYCLINE HYCLATE 100 MG PO CAPS
100.0000 mg | ORAL_CAPSULE | Freq: Two times a day (BID) | ORAL | 0 refills | Status: AC
Start: 2024-04-17 — End: 2024-04-24

## 2024-04-17 NOTE — ED Provider Notes (Signed)
 [facility]    CSN: [id number] Arrival date & time: 04/17/24  1310      History   Chief Complaint Chief Complaint  Patient presents with   Cough    Entered by patient    HPI Matthew Schroeder is a 28 y.o. male.    Cough Here for cough productive of yellow mucus, going on for more than 2 weeks. No nasal congestion or rhinorrhea or sore throat. No ear pain or sinus pressure.  No fever at any point. Breathing is mildly problematic, no h/o asthma.  NKDA  Takes cabenuva  injections twice monthly for HIV  Past Medical History:  Diagnosis Date   HIV disease (HCC)     Patient Active Problem List   Diagnosis Date Noted   Healthcare maintenance 11/20/2018   Mixed bipolar I disorder (HCC) 07/16/2016   Attention deficit hyperactivity disorder (ADHD) 07/16/2016   HIV (human immunodeficiency virus infection) (HCC) 06/08/2016    Past Surgical History:  Procedure Laterality Date   NO PAST SURGERIES         Home Medications    Prior to Admission medications   Medication Sig Start Date End Date Taking? Authorizing Provider  benzonatate  (TESSALON ) 100 MG capsule Take 1 capsule (100 mg total) by mouth 3 (three) times daily as needed for cough. 04/17/24  Yes Ann Keto, MD  doxycycline  (VIBRAMYCIN ) 100 MG capsule Take 1 capsule (100 mg total) by mouth 2 (two) times daily for 7 days. 04/17/24 04/24/24 Yes Stephonie Wilcoxen, Paige Boatman, MD  cabotegravir  & rilpivirine  ER (CABENUVA ) 600 & 900 MG/3ML injection Inject 1 kit into the muscle every 2 (two) months. 10/03/23   Sonya Duster, RPH-CPP    Family History Family History  Problem Relation Age of Onset   Colon cancer Neg Hx    Esophageal cancer Neg Hx    Stomach cancer Neg Hx     Social History Social History   Tobacco Use   Smoking status: Never   Smokeless tobacco: Never  Vaping Use   Vaping status: Never Used  Substance Use Topics   Alcohol use: No   Drug use: Yes    Types: Marijuana     Allergies    Patient has no known allergies.   Review of Systems Review of Systems  Respiratory:  Positive for cough.      Physical Exam Triage Vital Signs ED Triage Vitals  Encounter Vitals Group     BP 04/17/24 1325 (!) 147/100     Systolic BP Percentile --      Diastolic BP Percentile --      Pulse Rate 04/17/24 1325 74     Resp 04/17/24 1325 16     Temp 04/17/24 1325 98 F (36.7 C)     Temp Source 04/17/24 1325 Oral     SpO2 04/17/24 1325 95 %     Weight --      Height --      Head Circumference --      Peak Flow --      Pain Score 04/17/24 1324 0     Pain Loc --      Pain Education --      Exclude from Growth Chart --    No data found.  Updated Vital Signs BP (!) 147/100 (BP Location: Right Arm)   Pulse 74   Temp 98 F (36.7 C) (Oral)   Resp 16   SpO2 95%   Visual Acuity Right Eye Distance:  Left Eye Distance:   Bilateral Distance:    Right Eye Near:   Left Eye Near:    Bilateral Near:     Physical Exam Vitals reviewed.  Constitutional:      General: He is not in acute distress.    Appearance: He is not toxic-appearing.  HENT:     Nose: Nose normal.     Mouth/Throat:     Mouth: Mucous membranes are moist.     Pharynx: No oropharyngeal exudate or posterior oropharyngeal erythema.  Eyes:     Extraocular Movements: Extraocular movements intact.     Conjunctiva/sclera: Conjunctivae normal.     Pupils: Pupils are equal, round, and reactive to light.  Cardiovascular:     Rate and Rhythm: Normal rate and regular rhythm.     Heart sounds: No murmur heard. Pulmonary:     Effort: No respiratory distress.     Breath sounds: No stridor. No wheezing, rhonchi or rales.     Comments: Air movement is good Musculoskeletal:     Cervical back: Neck supple.  Lymphadenopathy:     Cervical: No cervical adenopathy.  Skin:    Capillary Refill: Capillary refill takes less than 2 seconds.     Coloration: Skin is not jaundiced or pale.  Neurological:     General: No  focal deficit present.     Mental Status: He is alert and oriented to person, place, and time.  Psychiatric:        Behavior: Behavior normal.      UC Treatments / Results  Labs (all labs ordered are listed, but only abnormal results are displayed) Labs Reviewed - No data to display  EKG   Radiology No results found.  Procedures Procedures (including critical care time)  Medications Ordered in UC Medications - No data to display  Initial Impression / Assessment and Plan / UC Course  I have reviewed the triage vital signs and the nursing notes.  Pertinent labs & imaging results that were available during my care of the patient were reviewed by me and considered in my medical decision making (see chart for details).     By my review there is no infiltrate or fluid on the chest x-ray.  He is advised of radiology overread.  There was an old film from 2022 available for comparison.  Doxycycline  is sent in for possible bronchitis and Tessalon  Perles are sent in for the cough. Final Clinical Impressions(s) / UC Diagnoses   Final diagnoses:  Acute cough     Discharge Instructions      By my review there is no pneumonia or fluid on your x-rays.    The radiologist will also read your x-ray, and if their interpretation differs significantly from mine, and the management of your condition would change, we will call you.  Take doxycycline  100 mg --1 capsule 2 times daily for 7 days  Take benzonatate  100 mg, 1 tab every 8 hours as needed for cough.    ED Prescriptions     Medication Sig Dispense Auth. Provider   benzonatate  (TESSALON ) 100 MG capsule Take 1 capsule (100 mg total) by mouth 3 (three) times daily as needed for cough. 21 capsule Ann Keto, MD   doxycycline  (VIBRAMYCIN ) 100 MG capsule Take 1 capsule (100 mg total) by mouth 2 (two) times daily for 7 days. 14 capsule Ellsworth Haas, Kelci Petrella K, MD      PDMP not reviewed this encounter.   Ann Keto,  MD 04/17/24 [phone]

## 2024-04-17 NOTE — Discharge Instructions (Addendum)
 By my review there is no pneumonia or fluid on your x-rays.    The radiologist will also read your x-ray, and if their interpretation differs significantly from mine, and the management of your condition would change, we will call you.  Take doxycycline  100 mg --1 capsule 2 times daily for 7 days  Take benzonatate  100 mg, 1 tab every 8 hours as needed for cough.

## 2024-04-17 NOTE — ED Triage Notes (Signed)
 Pt had cough for 2 weeks. Reports productive with yellow phlegm. Denies taking any medications for symptoms.

## 2024-04-20 ENCOUNTER — Telehealth: Payer: Self-pay

## 2024-04-20 NOTE — Telephone Encounter (Signed)
 [facility] Patient Advocate Encounter  Patient's medications CABENUVA  have been couriered to [facility] from [facility] and will be administered at the patients appointment on 04/26/24.  Verline Glow, CPhT Specialty Pharmacy Patient [facility] for Infectious Disease Phone: [phone] Fax:  [phone]

## 2024-04-25 NOTE — Progress Notes (Unsigned)
 HPI: Matthew Schroeder is a 28 y.o. male who presents to the [facility] pharmacy clinic for Cabenuva  administration.  Patient Active Problem List   Diagnosis Date Noted   Healthcare maintenance 11/20/2018   Mixed bipolar I disorder (HCC) 07/16/2016   Attention deficit hyperactivity disorder (ADHD) 07/16/2016   HIV (human immunodeficiency virus infection) (HCC) 06/08/2016    Patient's Medications  New Prescriptions   No medications on file  Previous Medications   BENZONATATE  (TESSALON ) 100 MG CAPSULE    Take 1 capsule (100 mg total) by mouth 3 (three) times daily as needed for cough.   CABOTEGRAVIR  & RILPIVIRINE  ER (CABENUVA ) 600 & 900 MG/3ML INJECTION    Inject 1 kit into the muscle every 2 (two) months.  Modified Medications   No medications on file  Discontinued Medications   No medications on file    Allergies: No Known Allergies  Labs: Lab Results  Component Value Date   HIV1RNAQUANT <20 (H) 12/22/2023   HIV1RNAQUANT Not Detected 11/11/2023   HIV1RNAQUANT Not Detected 08/18/2023   CD4TABS 1,254 11/11/2023   CD4TABS 724 11/13/2021   CD4TABS 1,278 03/21/2021    RPR and STI Lab Results  Component Value Date   LABRPR NON-REACTIVE 11/11/2023   LABRPR NON-REACTIVE 08/18/2023   LABRPR REACTIVE (A) 04/30/2023   LABRPR NON-REACTIVE 10/29/2022   LABRPR NON-REACTIVE 07/23/2022   RPRTITER 1:1 (H) 04/30/2023   RPRTITER 1:1 (H) 11/13/2021   RPRTITER 1:1 (H) 03/21/2021   RPRTITER 1:2 (H) 09/12/2020   RPRTITER 1:4 (H) 05/10/2020    STI Results GC CT  11/11/2023  2:30 PM Negative    Negative    Negative  Negative    Negative    Negative   08/18/2023  4:00 PM Positive    Negative  Positive    Negative   08/18/2023  3:52 PM Negative  Negative   04/30/2023  2:09 PM Negative  Negative   10/29/2022 10:24 AM Negative    Negative  Negative    Negative   09/12/2020  3:28 PM Negative  Negative   06/10/2019 12:00 AM Negative  Negative   05/05/2018 12:00 AM **POSITIVE**   Negative   09/02/2017 12:00 AM Negative  Negative     Hepatitis B Lab Results  Component Value Date   HEPBSAB REACTIVE (A) 07/23/2022   HEPBSAG NON-REACTIVE 07/23/2022   Hepatitis C Lab Results  Component Value Date   HEPCAB NON-REACTIVE 07/23/2022   Hepatitis A Lab Results  Component Value Date   HAV REACTIVE (A) 07/23/2022   Lipids: Lab Results  Component Value Date   CHOL 170 11/11/2023   TRIG 154 (H) 11/11/2023   HDL 38 (L) 11/11/2023   CHOLHDL 4.5 11/11/2023   LDLCALC 105 (H) 11/11/2023    TARGET DATE: The 29th  Assessment: Matthew Schroeder presents today for his maintenance Cabenuva  injections. Past injections were tolerated well without issues. Last HIV RNA was not detected in January. Doing well with no issues today. Due for labs at next visit with Dr. Levern Reader.  Administered cabotegravir  600mg /37mL in left upper outer quadrant of the gluteal muscle. Administered rilpivirine  900 mg/3mL in the right upper outer quadrant of the gluteal muscle. No issues with injections. He will follow up in 2 months for next set of injections.  Plan: - Cabenuva  injections administered - Next injections scheduled for 06/17/24 with Dr. Levern Reader; 08/16/24 and 10/18/24 with me - Call with any issues or questions  Shauni Henner L. Tawonna Esquer, PharmD, BCIDP, AAHIVP, CPP Clinical Pharmacist Practitioner - Infectious Diseases  Clinical Pharmacist Lead - Specialty Pharmacy [facility] for Infectious Disease 04/25/2024, 6:17 PM

## 2024-04-26 ENCOUNTER — Ambulatory Visit (INDEPENDENT_AMBULATORY_CARE_PROVIDER_SITE_OTHER): Admitting: Pharmacist

## 2024-04-26 ENCOUNTER — Other Ambulatory Visit: Payer: Self-pay

## 2024-04-26 DIAGNOSIS — B20 Human immunodeficiency virus [HIV] disease: Secondary | ICD-10-CM | POA: Diagnosis not present

## 2024-04-26 DIAGNOSIS — Z23 Encounter for immunization: Secondary | ICD-10-CM | POA: Diagnosis not present

## 2024-04-26 MED ORDER — CABOTEGRAVIR & RILPIVIRINE ER 600 & 900 MG/3ML IM SUER
1.0000 | Freq: Once | INTRAMUSCULAR | Status: AC
  Administered 2024-04-26: 1 via INTRAMUSCULAR

## 2024-05-29 ENCOUNTER — Other Ambulatory Visit: Payer: Self-pay

## 2024-05-29 ENCOUNTER — Encounter (HOSPITAL_COMMUNITY): Payer: Self-pay

## 2024-05-29 ENCOUNTER — Ambulatory Visit (HOSPITAL_COMMUNITY)
Admission: EM | Admit: 2024-05-29 | Discharge: 2024-05-29 | Disposition: A | Discharge status: Home / Self Care | Attending: Nurse Practitioner | Admitting: Nurse Practitioner

## 2024-05-29 DIAGNOSIS — R3911 Hesitancy of micturition: Secondary | ICD-10-CM | POA: Insufficient documentation

## 2024-05-29 DIAGNOSIS — Z113 Encounter for screening for infections with a predominantly sexual mode of transmission: Secondary | ICD-10-CM | POA: Diagnosis present

## 2024-05-29 DIAGNOSIS — R3915 Urgency of urination: Secondary | ICD-10-CM | POA: Diagnosis present

## 2024-05-29 LAB — CBC
HCT: 44.4 % (ref 39.0–52.0)
Hemoglobin: 14.9 g/dL (ref 13.0–17.0)
MCH: 29.5 pg (ref 26.0–34.0)
MCHC: 33.6 g/dL (ref 30.0–36.0)
MCV: 87.9 fL (ref 80.0–100.0)
Platelets: 272 K/uL (ref 150–400)
RBC: 5.05 MIL/uL (ref 4.22–5.81)
RDW: 13.3 % (ref 11.5–15.5)
WBC: 4.6 K/uL (ref 4.0–10.5)
nRBC: 0 % (ref 0.0–0.2)

## 2024-05-29 LAB — POCT URINALYSIS DIP (MANUAL ENTRY)
Bilirubin, UA: NEGATIVE
Glucose, UA: NEGATIVE mg/dL
Leukocytes, UA: NEGATIVE
Nitrite, UA: NEGATIVE
Spec Grav, UA: 1.025 (ref 1.010–1.025)
Urobilinogen, UA: 0.2 U/dL
pH, UA: 6 (ref 5.0–8.0)

## 2024-05-29 LAB — BASIC METABOLIC PANEL WITH GFR
Anion gap: 13 (ref 5–15)
BUN: 9 mg/dL (ref 6–20)
CO2: 24 mmol/L (ref 22–32)
Calcium: 9.6 mg/dL (ref 8.9–10.3)
Chloride: 104 mmol/L (ref 98–111)
Creatinine, Ser: 1.15 mg/dL (ref 0.61–1.24)
GFR, Estimated: >60 mL/min (ref >60)
Glucose, Bld: 92 mg/dL (ref 70–99)
Potassium: 4.1 mmol/L (ref 3.5–5.1)
Sodium: 141 mmol/L (ref 135–145)

## 2024-05-29 MED ORDER — AEROCHAMBER PLUS FLO-VU MEDIUM MISC: 1.0000 | Freq: Once | Status: DC

## 2024-05-29 MED ORDER — LIDOCAINE HCL (PF) 1 % IJ SOLN
INTRAMUSCULAR | Status: AC
  Filled 2024-05-29: qty 2

## 2024-05-29 MED ORDER — METHYLPREDNISOLONE SODIUM SUCC 125 MG IJ SOLR: 80.0000 mg | Freq: Once | INTRAMUSCULAR | Status: DC

## 2024-05-29 MED ORDER — TAMSULOSIN HCL 0.4 MG PO CAPS
0.4000 mg | ORAL_CAPSULE | Freq: Every day | ORAL | 0 refills | Status: AC
Start: 2024-05-29 — End: ?

## 2024-05-29 MED ORDER — ALBUTEROL SULFATE HFA 108 (90 BASE) MCG/ACT IN AERS: 2.0000 | INHALATION_SPRAY | Freq: Once | RESPIRATORY_TRACT | Status: DC

## 2024-05-29 MED ORDER — CEFTRIAXONE SODIUM 500 MG IJ SOLR
INTRAMUSCULAR | Status: AC
Start: 2024-05-29 — End: 2024-05-29
  Filled 2024-05-29: qty 500

## 2024-05-29 MED ORDER — CEFTRIAXONE SODIUM 500 MG IJ SOLR
500.0000 mg | INTRAMUSCULAR | Status: DC
  Administered 2024-05-29: 500 mg via INTRAMUSCULAR

## 2024-05-29 MED ORDER — DOXYCYCLINE HYCLATE 100 MG PO CAPS
100.0000 mg | ORAL_CAPSULE | Freq: Two times a day (BID) | ORAL | 0 refills | Status: AC
Start: 2024-05-29 — End: 2024-06-05

## 2024-05-29 NOTE — Discharge Instructions (Addendum)
 Take doxycycline  every 12 hours for the next 7 days.  We gave you a shot of rocephin  today. Staff will call if your blood work is abnormal.   Take Flomax  every 24 hours as needed for urinary frequency and hesitancy.   Follow-up with PCP if symptoms fail to improve in the next 2-3 days.  If you develop any new or worsening symptoms or if your symptoms do not start to improve, please return here or follow-up with your primary care provider. If your symptoms are severe, please go to the emergency room.

## 2024-05-29 NOTE — ED Triage Notes (Signed)
 Pt states that starting a week ago he started having urinary frequency and the urge to urinate. He states when he goes it is not very much. Pt denies any discharge or likelihood of STD. Pt also denies pain or fever.

## 2024-05-29 NOTE — ED Provider Notes (Signed)
 [facility]    CSN: [id number] Arrival date & time: 05/29/24  1517      History   Chief Complaint Chief Complaint  Patient presents with   Urinary Frequency    HPI Matthew Schroeder is a 28 y.o. male.   Matthew Schroeder is a 28 y.o. male presenting for chief complaint of urinary frequency, urinary urgency, and urinary hesitancy with weakened urinary stream that started 5 days ago.  Denies dysuria, gross hematuria, body aches, fever, chills, nausea, vomiting, flank pain, abdominal pain, low back pain, dizziness, malaise, penile discharge, and penile rash.  No recent known exposures to STDs.  Recently sexually active 1 month ago with new male partner.  History of HIV, takes antiviral as prescribed.  Drinks plenty of water and denies frequent intake of urinary irritants.  Denies history of diabetes.  He has not attempted use of any over-the-counter medications help with symptoms PTA.   Urinary Frequency    Past Medical History:  Diagnosis Date   HIV disease (HCC)     Patient Active Problem List   Diagnosis Date Noted   Healthcare maintenance 11/20/2018   Mixed bipolar I disorder (HCC) 07/16/2016   Attention deficit hyperactivity disorder (ADHD) 07/16/2016   HIV (human immunodeficiency virus infection) (HCC) 06/08/2016    Past Surgical History:  Procedure Laterality Date   NO PAST SURGERIES         Home Medications    Prior to Admission medications   Medication Sig Start Date End Date Taking? Authorizing Provider  doxycycline  (VIBRAMYCIN ) 100 MG capsule Take 1 capsule (100 mg total) by mouth 2 (two) times daily for 7 days. 05/29/24 06/05/24 Yes Enedelia Dorna CHRISTELLA, FNP  tamsulosin  (FLOMAX ) 0.4 MG CAPS capsule Take 1 capsule (0.4 mg total) by mouth daily. 05/29/24  Yes Enedelia Dorna CHRISTELLA, FNP  benzonatate  (TESSALON ) 100 MG capsule Take 1 capsule (100 mg total) by mouth 3 (three) times daily as needed for cough. 04/17/24   Vonna Sharlet POUR, MD  cabotegravir   & rilpivirine  ER (CABENUVA ) 600 & 900 MG/3ML injection Inject 1 kit into the muscle every 2 (two) months. 10/03/23   Waddell Alan PARAS, RPH-CPP    Family History Family History  Problem Relation Age of Onset   Colon cancer Neg Hx    Esophageal cancer Neg Hx    Stomach cancer Neg Hx     Social History Social History   Tobacco Use   Smoking status: Never   Smokeless tobacco: Never  Vaping Use   Vaping status: Never Used  Substance Use Topics   Alcohol use: No   Drug use: Yes    Types: Marijuana     Allergies   Patient has no known allergies.   Review of Systems Review of Systems  Genitourinary:  Positive for frequency.  Per HPI   Physical Exam Triage Vital Signs ED Triage Vitals  Encounter Vitals Group     BP 05/29/24 1552 (!) 135/92     Girls Systolic BP Percentile --      Girls Diastolic BP Percentile --      Boys Systolic BP Percentile --      Boys Diastolic BP Percentile --      Pulse Rate 05/29/24 1552 82     Resp 05/29/24 1552 20     Temp 05/29/24 1552 97.9 F (36.6 C)     Temp Source 05/29/24 1552 Oral     SpO2 05/29/24 1552 96 %     Weight --  Height --      Head Circumference --      Peak Flow --      Pain Score 05/29/24 1550 0     Pain Loc --      Pain Education --      Exclude from Growth Chart --    No data found.  Updated Vital Signs BP (!) 135/92 (BP Location: Left Arm)   Pulse 82   Temp 97.9 F (36.6 C) (Oral)   Resp 20   SpO2 96%   Visual Acuity Right Eye Distance:   Left Eye Distance:   Bilateral Distance:    Right Eye Near:   Left Eye Near:    Bilateral Near:     Physical Exam Vitals and nursing note reviewed.  Constitutional:      Appearance: He is not ill-appearing or toxic-appearing.  HENT:     Head: Normocephalic and atraumatic.     Right Ear: Hearing and external ear normal.     Left Ear: Hearing and external ear normal.     Nose: Nose normal.     Mouth/Throat:     Lips: Pink.  Eyes:     General: Lids are  normal. Vision grossly intact. Gaze aligned appropriately.     Extraocular Movements: Extraocular movements intact.     Conjunctiva/sclera: Conjunctivae normal.  Pulmonary:     Effort: Pulmonary effort is normal.  Abdominal:     General: Bowel sounds are normal.     Palpations: Abdomen is soft.     Tenderness: There is no abdominal tenderness. There is no right CVA tenderness, left CVA tenderness or guarding.  Musculoskeletal:     Cervical back: Neck supple.  Skin:    General: Skin is warm and dry.     Capillary Refill: Capillary refill takes less than 2 seconds.     Findings: No rash.  Neurological:     General: No focal deficit present.     Mental Status: He is alert and oriented to person, place, and time. Mental status is at baseline.     Cranial Nerves: No dysarthria or facial asymmetry.  Psychiatric:        Mood and Affect: Mood normal.        Speech: Speech normal.        Behavior: Behavior normal.        Thought Content: Thought content normal.        Judgment: Judgment normal.      UC Treatments / Results  Labs (all labs ordered are listed, but only abnormal results are displayed) Labs Reviewed  POCT URINALYSIS DIP (MANUAL ENTRY) - Abnormal; Notable for the following components:      Result Value   Color, UA other (*)    Ketones, POC UA trace (5) (*)    Blood, UA trace-intact (*)    Protein Ur, POC trace (*)    All other components within normal limits  URINE CULTURE  CBC  BASIC METABOLIC PANEL WITH GFR  CYTOLOGY, (ORAL, ANAL, URETHRAL) ANCILLARY ONLY  CYTOLOGY, (ORAL, ANAL, URETHRAL) ANCILLARY ONLY    EKG   Radiology No results found.  Procedures Procedures (including critical care time)  Medications Ordered in UC Medications  cefTRIAXone  (ROCEPHIN ) injection 500 mg (500 mg Intramuscular Given 05/29/24 1715)    Initial Impression / Assessment and Plan / UC Course  I have reviewed the triage vital signs and the nursing notes.  Pertinent labs &  imaging results that were available during my  care of the patient were reviewed by me and considered in my medical decision making (see chart for details).   1.  Urinary urgency, urinary hesitancy, screening for STD Unclear etiology of symptoms.  Urinalysis shows trace RBC, small protein, and trace ketones without overt signs of urinary tract infection/infectious process. Differential includes STD, pyelonephritis, UTI, nephrolithiasis, acute bacterial prostatitis.  Lower clinical suspicion for acute bacterial prostatitis given lack of systemic symptoms.  I would like to go ahead and treat for potential STD causes of urinary symptoms with Rocephin  500 mg IM in clinic and doxycycline  twice daily for 7 days.  Anal and urethral STD swabs are pending, staff will call and treat per protocol for positive results.  Urine culture is pending. No red flags on exam.  I would like to trial use of Flomax  once daily for the next 5 to 7 days for urinary hesitancy symptoms. Increase fluid intake to stay well-hydrated. Basic labs are pending to evaluate for kidney function and signs of infection on CBC.  Counseled patient on potential for adverse effects with medications prescribed/recommended today, strict ER and return-to-clinic precautions discussed, patient verbalized understanding.    Final Clinical Impressions(s) / UC Diagnoses   Final diagnoses:  Urinary urgency  Urinary hesitancy  Screening for STD (sexually transmitted disease)     Discharge Instructions      Take doxycycline  every 12 hours for the next 7 days.  We gave you a shot of rocephin  today. Staff will call if your blood work is abnormal.   Take Flomax  every 24 hours as needed for urinary frequency and hesitancy.   Follow-up with PCP if symptoms fail to improve in the next 2-3 days.  If you develop any new or worsening symptoms or if your symptoms do not start to improve, please return here or follow-up with your primary care  provider. If your symptoms are severe, please go to the emergency room.   ED Prescriptions     Medication Sig Dispense Auth. Provider   doxycycline  (VIBRAMYCIN ) 100 MG capsule Take 1 capsule (100 mg total) by mouth 2 (two) times daily for 7 days. 14 capsule Enedelia Dorna HERO, FNP   tamsulosin  (FLOMAX ) 0.4 MG CAPS capsule Take 1 capsule (0.4 mg total) by mouth daily. 7 capsule Philip Eckersley M, FNP      PDMP not reviewed this encounter.   Enedelia Dorna HERO, [place] 05/29/24 1734

## 2024-05-30 ENCOUNTER — Ambulatory Visit (HOSPITAL_COMMUNITY): Payer: Self-pay | Admitting: Internal Medicine

## 2024-05-30 LAB — URINE CULTURE: Culture: <10000 — AB

## 2024-05-31 LAB — CYTOLOGY, (ORAL, ANAL, URETHRAL) ANCILLARY ONLY
Chlamydia: NEGATIVE
Chlamydia: NEGATIVE
Comment: NEGATIVE
Comment: NEGATIVE
Comment: NORMAL
Comment: NEGATIVE
Comment: NORMAL
Neisseria Gonorrhea: NEGATIVE
Neisseria Gonorrhea: NEGATIVE
Trichomonas: NEGATIVE

## 2024-06-10 ENCOUNTER — Other Ambulatory Visit: Payer: Self-pay

## 2024-06-10 ENCOUNTER — Other Ambulatory Visit (HOSPITAL_COMMUNITY): Payer: Self-pay

## 2024-06-10 NOTE — Progress Notes (Signed)
 Specialty Pharmacy Initial Fill Coordination Note  Matthew Schroeder is a 28 y.o. male contacted today regarding initial fill of specialty medication(s) Cabotegravir  & Rilpivirine  (CABENUVA )   Patient requested Courier to Provider Office   Delivery date: 06/14/24   Verified address: [address] [place] [address]   Medication will be filled on 06/11/24.   Patient is aware of 0.00 copayment.

## 2024-06-11 ENCOUNTER — Other Ambulatory Visit: Payer: Self-pay

## 2024-06-14 ENCOUNTER — Telehealth: Payer: Self-pay

## 2024-06-14 NOTE — Telephone Encounter (Signed)
 [facility] Patient Advocate Encounter  Patient's medications Cabenuva  have been couriered to [facility] from [facility] and will be administered at the patients appointment on 06/17/24.  Arland Hutchinson, CPhT Specialty Pharmacy Patient [facility] for Infectious Disease Phone: [phone] Fax:  [phone]

## 2024-06-16 NOTE — Progress Notes (Unsigned)
 HPI: Matthew Schroeder is a 28 y.o. male who presents to the [facility] pharmacy clinic for Cabenuva  administration.  Patient Active Problem List   Diagnosis Date Noted   Healthcare maintenance 11/20/2018   Mixed bipolar I disorder (HCC) 07/16/2016   Attention deficit hyperactivity disorder (ADHD) 07/16/2016   HIV (human immunodeficiency virus infection) (HCC) 06/08/2016    Patient's Medications  New Prescriptions   No medications on file  Previous Medications   BENZONATATE  (TESSALON ) 100 MG CAPSULE    Take 1 capsule (100 mg total) by mouth 3 (three) times daily as needed for cough.   CABOTEGRAVIR  & RILPIVIRINE  ER (CABENUVA ) 600 & 900 MG/3ML INJECTION    Inject 1 kit into the muscle every 2 (two) months.   TAMSULOSIN  (FLOMAX ) 0.4 MG CAPS CAPSULE    Take 1 capsule (0.4 mg total) by mouth daily.  Modified Medications   No medications on file  Discontinued Medications   No medications on file    Allergies: No Known Allergies  Past Medical History: Past Medical History:  Diagnosis Date   HIV disease (HCC)     Social History: Social History   Socioeconomic History   Marital status: Single    Spouse name: Not on file   Number of children: 0   Years of education: Not on file   Highest education level: Not on file  Occupational History   Occupation: Phlebotomist  Tobacco Use   Smoking status: Never   Smokeless tobacco: Never  Vaping Use   Vaping status: Never Used  Substance and Sexual Activity   Alcohol use: No   Drug use: Yes    Types: Marijuana   Sexual activity: Not Currently    Partners: Male    Birth control/protection: Condom    Comment: declined condoms  Other Topics Concern   Not on file  Social History Narrative   ** Merged History Encounter **       Social Drivers of Health   Financial Resource Strain: Not on file  Food Insecurity: Not on file  Transportation Needs: Not on file  Physical Activity: Not on file  Stress: Not on file  Social  Connections: Unknown (11/06/2022)   [facility]   Social Network    Social Network: Not on file    Labs: Lab Results  Component Value Date   HIV1RNAQUANT <20 (H) 12/22/2023   HIV1RNAQUANT Not Detected 11/11/2023   HIV1RNAQUANT Not Detected 08/18/2023   CD4TABS 1,254 11/11/2023   CD4TABS 724 11/13/2021   CD4TABS 1,278 03/21/2021    RPR and STI Lab Results  Component Value Date   LABRPR NON-REACTIVE 11/11/2023   LABRPR NON-REACTIVE 08/18/2023   LABRPR REACTIVE (A) 04/30/2023   LABRPR NON-REACTIVE 10/29/2022   LABRPR NON-REACTIVE 07/23/2022   RPRTITER 1:1 (H) 04/30/2023   RPRTITER 1:1 (H) 11/13/2021   RPRTITER 1:1 (H) 03/21/2021   RPRTITER 1:2 (H) 09/12/2020   RPRTITER 1:4 (H) 05/10/2020    STI Results GC CT  05/29/2024  5:08 PM Negative  Negative   05/29/2024  4:04 PM Negative  Negative   11/11/2023  2:30 PM Negative    Negative    Negative  Negative    Negative    Negative   08/18/2023  4:00 PM Positive    Negative  Positive    Negative   08/18/2023  3:52 PM Negative  Negative   04/30/2023  2:09 PM Negative  Negative   10/29/2022 10:24 AM Negative    Negative  Negative  Negative   09/12/2020  3:28 PM Negative  Negative   06/10/2019 12:00 AM Negative  Negative   05/05/2018 12:00 AM **POSITIVE**  Negative   09/02/2017 12:00 AM Negative  Negative     Hepatitis B Lab Results  Component Value Date   HEPBSAB REACTIVE (A) 07/23/2022   HEPBSAG NON-REACTIVE 07/23/2022   Hepatitis C Lab Results  Component Value Date   HEPCAB NON-REACTIVE 07/23/2022   Hepatitis A Lab Results  Component Value Date   HAV REACTIVE (A) 07/23/2022   Lipids: Lab Results  Component Value Date   CHOL 170 11/11/2023   TRIG 154 (H) 11/11/2023   HDL 38 (L) 11/11/2023   CHOLHDL 4.5 11/11/2023   LDLCALC 105 (H) 11/11/2023    TARGET DATE:  The 29th of the month  Assessment: Matthew Schroeder presents today for their maintenance Cabenuva  injections. Initial/past  injections were tolerated well without issues. No problems with systemic effects of injections.   Administered cabotegravir  600mg /81mL in left upper outer quadrant of the gluteal muscle. Administered rilpivirine  900 mg/3mL in the right upper outer quadrant of the gluteal muscle. Monitored patient for 10 minutes after injection. Injections were tolerated well without issue. Patient will follow up in 2 months for next injection. Will check HIV RNA today.  Eligible for Shingles vaccine which he accepts today. 2/2 Shingles vaccine due in 2-6 months.   Politely declines STI testing today. STI testing from urgent care in early July was negative after he was active with a new partner.   Plan: - Cabenuva  injections administered - Check HIV RNA - Administer 1/2 Shingles vaccine  - Next injections scheduled for 9/22 and 11/24 with  Cassie  - Call with any issues or questions  Alan Geralds, PharmD, CPP, BCIDP, AAHIVP Clinical Pharmacist Practitioner Infectious Diseases Clinical Pharmacist [facility]

## 2024-06-17 ENCOUNTER — Ambulatory Visit: Payer: Self-pay | Admitting: Internal Medicine

## 2024-06-17 ENCOUNTER — Other Ambulatory Visit: Payer: Self-pay

## 2024-06-17 ENCOUNTER — Ambulatory Visit: Admitting: Pharmacist

## 2024-06-17 DIAGNOSIS — B20 Human immunodeficiency virus [HIV] disease: Secondary | ICD-10-CM | POA: Diagnosis not present

## 2024-06-17 DIAGNOSIS — Z23 Encounter for immunization: Secondary | ICD-10-CM

## 2024-06-17 MED ORDER — CABOTEGRAVIR & RILPIVIRINE ER 600 & 900 MG/3ML IM SUER
1.0000 | Freq: Once | INTRAMUSCULAR | Status: AC
  Administered 2024-06-17: 1 via INTRAMUSCULAR

## 2024-06-18 ENCOUNTER — Encounter: Payer: Self-pay | Admitting: Internal Medicine

## 2024-06-19 LAB — HIV-1 RNA QUANT-NO REFLEX-BLD
HIV 1 RNA Quant: <20 {copies}/mL — AB
HIV-1 RNA Quant, Log: <1.3 {Log_copies}/mL — AB

## 2024-08-03 ENCOUNTER — Other Ambulatory Visit: Payer: Self-pay

## 2024-08-09 ENCOUNTER — Other Ambulatory Visit: Payer: Self-pay

## 2024-08-09 ENCOUNTER — Other Ambulatory Visit (HOSPITAL_COMMUNITY): Payer: Self-pay

## 2024-08-09 NOTE — Progress Notes (Signed)
 Specialty Pharmacy Refill Coordination Note  Matthew Schroeder is a 28 y.o. male assessed today regarding refills of clinic administered specialty medication(s) Cabotegravir  & Rilpivirine  (CABENUVA )   Clinic requested Courier to Provider Office   Delivery date: 08/12/24   Verified address: [address] [place] [address]   Medication will be filled on 08/11/24.

## 2024-08-11 ENCOUNTER — Other Ambulatory Visit: Payer: Self-pay

## 2024-08-12 ENCOUNTER — Telehealth: Payer: Self-pay

## 2024-08-12 NOTE — Telephone Encounter (Signed)
 [facility] Patient Advocate Encounter  Patient's medications CABENUVA  have been couriered to [facility] from [facility] and will be administered at the patients appointment on 08/16/24.  Charmaine Sharps, CPhT Specialty Pharmacy Patient [facility] for Infectious Disease Phone: [phone] Fax:  [phone]

## 2024-08-15 NOTE — Progress Notes (Unsigned)
 HPI: Matthew Schroeder is a 28 y.o. male who presents to the [facility] pharmacy clinic for Cabenuva  administration.  Patient Active Problem List   Diagnosis Date Noted   Healthcare maintenance 11/20/2018   Mixed bipolar I disorder (HCC) 07/16/2016   Attention deficit hyperactivity disorder (ADHD) 07/16/2016   HIV (human immunodeficiency virus infection) (HCC) 06/08/2016    Patient's Medications  New Prescriptions   No medications on file  Previous Medications   BENZONATATE  (TESSALON ) 100 MG CAPSULE    Take 1 capsule (100 mg total) by mouth 3 (three) times daily as needed for cough.   CABOTEGRAVIR  & RILPIVIRINE  ER (CABENUVA ) 600 & 900 MG/3ML INJECTION    Inject 1 kit into the muscle every 2 (two) months.   TAMSULOSIN  (FLOMAX ) 0.4 MG CAPS CAPSULE    Take 1 capsule (0.4 mg total) by mouth daily.  Modified Medications   No medications on file  Discontinued Medications   No medications on file    Allergies: No Known Allergies  Labs: Lab Results  Component Value Date   HIV1RNAQUANT <20 DETECTED (A) 06/17/2024   HIV1RNAQUANT <20 (H) 12/22/2023   HIV1RNAQUANT Not Detected 11/11/2023   CD4TABS 1,254 11/11/2023   CD4TABS 724 11/13/2021   CD4TABS 1,278 03/21/2021    RPR and STI Lab Results  Component Value Date   LABRPR NON-REACTIVE 11/11/2023   LABRPR NON-REACTIVE 08/18/2023   LABRPR REACTIVE (A) 04/30/2023   LABRPR NON-REACTIVE 10/29/2022   LABRPR NON-REACTIVE 07/23/2022   RPRTITER 1:1 (H) 04/30/2023   RPRTITER 1:1 (H) 11/13/2021   RPRTITER 1:1 (H) 03/21/2021   RPRTITER 1:2 (H) 09/12/2020   RPRTITER 1:4 (H) 05/10/2020    STI Results GC CT  05/29/2024  5:08 PM Negative  Negative   05/29/2024  4:04 PM Negative  Negative   11/11/2023  2:30 PM Negative    Negative    Negative  Negative    Negative    Negative   08/18/2023  4:00 PM Positive    Negative  Positive    Negative   08/18/2023  3:52 PM Negative  Negative   04/30/2023  2:09 PM Negative  Negative    10/29/2022 10:24 AM Negative    Negative  Negative    Negative   09/12/2020  3:28 PM Negative  Negative   06/10/2019 12:00 AM Negative  Negative   05/05/2018 12:00 AM **POSITIVE**  Negative   09/02/2017 12:00 AM Negative  Negative     Hepatitis B Lab Results  Component Value Date   HEPBSAB REACTIVE (A) 07/23/2022   HEPBSAG NON-REACTIVE 07/23/2022   Hepatitis C Lab Results  Component Value Date   HEPCAB NON-REACTIVE 07/23/2022   Hepatitis A Lab Results  Component Value Date   HAV REACTIVE (A) 07/23/2022   Lipids: Lab Results  Component Value Date   CHOL 170 11/11/2023   TRIG 154 (H) 11/11/2023   HDL 38 (L) 11/11/2023   CHOLHDL 4.5 11/11/2023   LDLCALC 105 (H) 11/11/2023    TARGET DATE: The 29th  Assessment: Matthew Schroeder presents today for his maintenance Cabenuva  injections. Past injections were tolerated well without issues. Last HIV RNA was undetectable in July. Will defer HIV RNA today. Doing well with no issues today.  Administered cabotegravir  600mg /23mL in left upper outer quadrant of the gluteal muscle. Administered rilpivirine  900 mg/3mL in the right upper outer quadrant of the gluteal muscle. No issues with injections. He will follow up in 2 months for next set of injections.  Discussed eligibility for the updated flu  vaccine. He politely declines today. He received his first Shingrix  dose on 06/17/24. Due to possible insurance issues, will defer the 2nd dose to next visit.   Plan: - Cabenuva  injections administered - Next injections scheduled for 10/18/24 with Cassie and 12/16/24 with Dr. Luiz - Call with any issues or questions  Izetta Carl, PharmD PGY1 Pharmacy Resident

## 2024-08-16 ENCOUNTER — Ambulatory Visit (INDEPENDENT_AMBULATORY_CARE_PROVIDER_SITE_OTHER): Admitting: Pharmacist

## 2024-08-16 ENCOUNTER — Other Ambulatory Visit: Payer: Self-pay

## 2024-08-16 DIAGNOSIS — Z23 Encounter for immunization: Secondary | ICD-10-CM

## 2024-08-16 DIAGNOSIS — B20 Human immunodeficiency virus [HIV] disease: Secondary | ICD-10-CM | POA: Diagnosis not present

## 2024-08-16 MED ORDER — CABOTEGRAVIR & RILPIVIRINE ER 600 & 900 MG/3ML IM SUER
1.0000 | Freq: Once | INTRAMUSCULAR | Status: AC
  Administered 2024-08-16: 1 via INTRAMUSCULAR

## 2024-10-06 ENCOUNTER — Other Ambulatory Visit: Payer: Self-pay | Admitting: Pharmacist

## 2024-10-06 ENCOUNTER — Other Ambulatory Visit: Payer: Self-pay

## 2024-10-06 ENCOUNTER — Other Ambulatory Visit (HOSPITAL_COMMUNITY): Payer: Self-pay

## 2024-10-06 DIAGNOSIS — Z21 Asymptomatic human immunodeficiency virus [HIV] infection status: Secondary | ICD-10-CM

## 2024-10-06 MED ORDER — CABOTEGRAVIR & RILPIVIRINE ER 600 & 900 MG/3ML IM SUER
1.0000 | INTRAMUSCULAR | 5 refills | Status: AC
  Filled 2024-10-06: qty 6, 60d supply, fill #0
  Filled 2024-12-06: qty 6, 60d supply, fill #1

## 2024-10-06 NOTE — Progress Notes (Signed)
 Specialty Pharmacy Refill Coordination Note  Matthew Schroeder is a 28 y.o. male assessed today regarding refills of clinic administered specialty medication(s) Cabotegravir  & Rilpivirine  (CABENUVA )   Clinic requested Courier to Provider Office   Delivery date: 10/13/24   Verified address: [address] [place] [address]   Medication will be filled on 10/12/24.

## 2024-10-13 ENCOUNTER — Telehealth: Payer: Self-pay

## 2024-10-13 NOTE — Telephone Encounter (Signed)
 [facility] Patient Advocate Encounter  Patient's medications CABENUVA  have been couriered to [facility] from [facility] and will be administered at the patients appointment on 10/18/24.  Charmaine Sharps, CPhT Specialty Pharmacy Patient [facility] for Infectious Disease Phone: [phone] Fax:  [phone]

## 2024-10-17 NOTE — Progress Notes (Unsigned)
 HPI: Matthew Schroeder is a 28 y.o. male who presents to the [facility] pharmacy clinic for Cabenuva  administration.  Referring ID Provider: Dr. Luiz  Patient Active Problem List   Diagnosis Date Noted   Healthcare maintenance 11/20/2018   Mixed bipolar I disorder (HCC) 07/16/2016   Attention deficit hyperactivity disorder (ADHD) 07/16/2016   HIV (human immunodeficiency virus infection) (HCC) 06/08/2016    Patient's Medications  New Prescriptions   No medications on file  Previous Medications   BENZONATATE  (TESSALON ) 100 MG CAPSULE    Take 1 capsule (100 mg total) by mouth 3 (three) times daily as needed for cough.   CABOTEGRAVIR  & RILPIVIRINE  ER (CABENUVA ) 600 & 900 MG/3ML INJECTION    Inject 1 kit into the muscle every 2 (two) months.   TAMSULOSIN  (FLOMAX ) 0.4 MG CAPS CAPSULE    Take 1 capsule (0.4 mg total) by mouth daily.  Modified Medications   No medications on file  Discontinued Medications   No medications on file    Allergies: No Known Allergies  Labs: Lab Results  Component Value Date   HIV1RNAQUANT <20 DETECTED (A) 06/17/2024   HIV1RNAQUANT <20 (H) 12/22/2023   HIV1RNAQUANT Not Detected 11/11/2023   CD4TABS 1,254 11/11/2023   CD4TABS 724 11/13/2021   CD4TABS 1,278 03/21/2021    RPR and STI Lab Results  Component Value Date   LABRPR NON-REACTIVE 11/11/2023   LABRPR NON-REACTIVE 08/18/2023   LABRPR REACTIVE (A) 04/30/2023   LABRPR NON-REACTIVE 10/29/2022   LABRPR NON-REACTIVE 07/23/2022   RPRTITER 1:1 (H) 04/30/2023   RPRTITER 1:1 (H) 11/13/2021   RPRTITER 1:1 (H) 03/21/2021   RPRTITER 1:2 (H) 09/12/2020   RPRTITER 1:4 (H) 05/10/2020    STI Results GC CT  05/29/2024  5:08 PM Negative  Negative   05/29/2024  4:04 PM Negative  Negative   11/11/2023  2:30 PM Negative    Negative    Negative  Negative    Negative    Negative   08/18/2023  4:00 PM Positive    Negative  Positive    Negative   08/18/2023  3:52 PM Negative  Negative   04/30/2023  2:09  PM Negative  Negative   10/29/2022 10:24 AM Negative    Negative  Negative    Negative   09/12/2020  3:28 PM Negative  Negative   06/10/2019 12:00 AM Negative  Negative   05/05/2018 12:00 AM **POSITIVE**  Negative   09/02/2017 12:00 AM Negative  Negative     Hepatitis B Lab Results  Component Value Date   HEPBSAB REACTIVE (A) 07/23/2022   HEPBSAG NON-REACTIVE 07/23/2022   Hepatitis C Lab Results  Component Value Date   HEPCAB NON-REACTIVE 07/23/2022   Hepatitis A Lab Results  Component Value Date   HAV REACTIVE (A) 07/23/2022   Lipids: Lab Results  Component Value Date   CHOL 170 11/11/2023   TRIG 154 (H) 11/11/2023   HDL 38 (L) 11/11/2023   CHOLHDL 4.5 11/11/2023   LDLCALC 105 (H) 11/11/2023    Target Date: 29th   Assessment: Haru presents today for his maintenance Cabenuva  injections. Past injections were tolerated well without issues. Last HIV RNA was undetectable on 06/17/2024. Doing well with no issues today.  Lab work:  - Oral/urine/rectal cytologies for GC/Chlamydia, RPR ***   Eligible vaccinations: Influenza, Covid, Shingrix  2/2  ***  Cabenuva : Administered cabotegravir  600mg /23mL in left upper outer quadrant of the gluteal muscle. Administered rilpivirine  900 mg/3mL in the right upper outer quadrant of the gluteal muscle.  No issues with injections. Mike will follow up in 2 months for next set of injections.  Plan: - Cabenuva  injections administered - Oral/urine/rectal cytologies for GC/Chlamydia, RPR ***  - Consider HIV RNA and lipid panel at next visit in January *** - Next injections scheduled for 12/16/2024 with Dr. Luiz, then *** 3/23-4/3 - Call with any issues or questions  Feliciano Close, PharmD PGY2 Infectious Diseases Pharmacy Resident

## 2024-10-18 ENCOUNTER — Other Ambulatory Visit: Payer: Self-pay

## 2024-10-18 ENCOUNTER — Ambulatory Visit: Payer: Self-pay | Admitting: Pharmacist

## 2024-10-18 DIAGNOSIS — Z21 Asymptomatic human immunodeficiency virus [HIV] infection status: Secondary | ICD-10-CM

## 2024-10-18 DIAGNOSIS — Z113 Encounter for screening for infections with a predominantly sexual mode of transmission: Secondary | ICD-10-CM

## 2024-10-18 MED ORDER — CABOTEGRAVIR & RILPIVIRINE ER 600 & 900 MG/3ML IM SUER
1.0000 | Freq: Once | INTRAMUSCULAR | Status: AC
  Administered 2024-10-18: 1 via INTRAMUSCULAR

## 2024-12-06 ENCOUNTER — Other Ambulatory Visit: Payer: Self-pay

## 2024-12-06 ENCOUNTER — Other Ambulatory Visit (HOSPITAL_COMMUNITY): Payer: Self-pay

## 2024-12-06 NOTE — Progress Notes (Signed)
 Specialty Pharmacy Refill Coordination Note  Matthew Schroeder is a 29 y.o. male assessed today regarding refills of clinic administered specialty medication(s) Cabotegravir  & Rilpivirine  (CABENUVA )   Clinic requested Courier to Provider Office   Delivery date: 12/09/24   Verified address: [address] [place] [address]   Medication will be filled on 12/08/24.

## 2024-12-08 ENCOUNTER — Other Ambulatory Visit: Payer: Self-pay

## 2024-12-09 ENCOUNTER — Other Ambulatory Visit (HOSPITAL_COMMUNITY): Payer: Self-pay

## 2024-12-10 ENCOUNTER — Other Ambulatory Visit: Payer: Self-pay

## 2024-12-14 ENCOUNTER — Other Ambulatory Visit: Payer: Self-pay

## 2024-12-16 ENCOUNTER — Ambulatory Visit: Admitting: Internal Medicine

## 2024-12-16 ENCOUNTER — Other Ambulatory Visit (HOSPITAL_COMMUNITY): Payer: Self-pay

## 2024-12-16 ENCOUNTER — Other Ambulatory Visit (HOSPITAL_COMMUNITY)
Admission: RE | Admit: 2024-12-16 | Discharge: 2024-12-16 | Disposition: A | Source: Ambulatory Visit | Attending: Internal Medicine | Admitting: Internal Medicine

## 2024-12-16 ENCOUNTER — Other Ambulatory Visit: Payer: Self-pay

## 2024-12-16 ENCOUNTER — Encounter: Payer: Self-pay | Admitting: Internal Medicine

## 2024-12-16 ENCOUNTER — Encounter: Admitting: Internal Medicine

## 2024-12-16 VITALS — BP 149/98 | HR 77 | Temp 97.6°F | Ht 63.0 in | Wt 194.0 lb

## 2024-12-16 DIAGNOSIS — Z113 Encounter for screening for infections with a predominantly sexual mode of transmission: Secondary | ICD-10-CM

## 2024-12-16 DIAGNOSIS — Z79899 Other long term (current) drug therapy: Secondary | ICD-10-CM

## 2024-12-16 DIAGNOSIS — Z21 Asymptomatic human immunodeficiency virus [HIV] infection status: Secondary | ICD-10-CM | POA: Insufficient documentation

## 2024-12-16 DIAGNOSIS — B20 Human immunodeficiency virus [HIV] disease: Secondary | ICD-10-CM

## 2024-12-16 MED ORDER — CABOTEGRAVIR & RILPIVIRINE ER 600 & 900 MG/3ML IM SUER
1.0000 | Freq: Once | INTRAMUSCULAR | Status: AC
  Administered 2024-12-16: 1 via INTRAMUSCULAR

## 2024-12-16 NOTE — Progress Notes (Signed)
 "     RFV: follow up for HIv disease  Patient ID: Matthew Schroeder, male   DOB: 02/07/1996, 29 y.o.   MRN: [id number]  HPI 28yo M well controlled hiv disease. CD 4 count of 1254/VL<20, On cabenuva  Q50months. Doing well overall. Denies any recent health issues.  Outpatient Encounter Medications as of 12/16/2024  Medication Sig   cabotegravir  & rilpivirine  ER (CABENUVA ) 600 & 900 MG/3ML injection Inject 1 kit into the muscle every 2 (two) months.   benzonatate  (TESSALON ) 100 MG capsule Take 1 capsule (100 mg total) by mouth 3 (three) times daily as needed for cough. (Patient not taking: Reported on 12/16/2024)   tamsulosin  (FLOMAX ) 0.4 MG CAPS capsule Take 1 capsule (0.4 mg total) by mouth daily. (Patient not taking: Reported on 12/16/2024)   No facility-administered encounter medications on file as of 12/16/2024.     Patient Active Problem List   Diagnosis Date Noted   Healthcare maintenance 11/20/2018   Mixed bipolar I disorder (HCC) 07/16/2016   Attention deficit hyperactivity disorder (ADHD) 07/16/2016   HIV (human immunodeficiency virus infection) (HCC) 06/08/2016     Health Maintenance Due  Topic Date Due   Medicare Annual Wellness (AWV)  Never done   Meningococcal B Vaccine (2 of 2 - Bexsero SCDM 2-dose series) 12/16/2015   COVID-19 Vaccine (2 - Pfizer risk series) 11/19/2022   Influenza Vaccine  06/25/2024     Review of Systems 12 point ros is otherwise negative Physical Exam   BP (!) 149/98   Pulse 77   Temp 97.6 F (36.4 C) (Temporal)   Ht 5' 3 (1.6 m)   Wt 194 lb (88 kg)   SpO2 97%   BMI 34.37 kg/m   Physical Exam  Constitutional: He is oriented to person, place, and time. He appears well-developed and well-nourished. No distress.  HENT:  Mouth/Throat: Oropharynx is clear and moist. No oropharyngeal exudate.  Cardiovascular: Normal rate, regular rhythm and normal heart sounds. Exam reveals no gallop and no friction rub.  No murmur heard.  Pulmonary/Chest:  Effort normal and breath sounds normal. No respiratory distress. He has no wheezes.  Lymphadenopathy:  He has no cervical adenopathy.  Neurological: He is alert and oriented to person, place, and time.  Skin: Skin is warm and dry. No rash noted. No erythema.  Psychiatric: He has a normal mood and affect. His behavior is normal.   Lab Results  Component Value Date   CD4TCELL 44 11/11/2023   Lab Results  Component Value Date   CD4TABS 1,254 11/11/2023   CD4TABS 724 11/13/2021   CD4TABS 1,278 03/21/2021   Lab Results  Component Value Date   HIV1RNAQUANT <20 DETECTED (A) 06/17/2024   Lab Results  Component Value Date   HEPBSAB REACTIVE (A) 07/23/2022   Lab Results  Component Value Date   LABRPR NON-REACTIVE 11/11/2023    CBC Lab Results  Component Value Date   WBC 4.6 05/29/2024   RBC 5.05 05/29/2024   HGB 14.9 05/29/2024   HCT 44.4 05/29/2024   PLT 272 05/29/2024   MCV 87.9 05/29/2024   MCH 29.5 05/29/2024   MCHC 33.6 05/29/2024   RDW 13.3 05/29/2024   LYMPHSABS 1,805 11/13/2021   MONOABS 0.7 05/05/2018   EOSABS 82 11/13/2021    BMET Lab Results  Component Value Date   NA 141 05/29/2024   K 4.1 05/29/2024   CL 104 05/29/2024   CO2 24 05/29/2024   GLUCOSE 92 05/29/2024   BUN 9 05/29/2024  CREATININE 1.15 05/29/2024   CALCIUM 9.6 05/29/2024   GFRNONAA >60 05/29/2024   GFRAA 106 03/21/2021      Assessment and Plan HIV disease= plan on giving cabenuva  today. We will also check CD 4 count, HIV VL, and kidney function  Long term medication management= will check cr  Health maintenance = will check for STI    "

## 2024-12-17 LAB — T-HELPER CELL (CD4) - (RCID CLINIC ONLY)
CD4 % Helper T Cell: 47 % (ref 33–65)
CD4 T Cell Abs: 1104 /uL (ref 400–1790)

## 2024-12-17 LAB — CYTOLOGY, (ORAL, ANAL, URETHRAL) ANCILLARY ONLY
Chlamydia: NEGATIVE
Chlamydia: NEGATIVE
Comment: NEGATIVE
Comment: NEGATIVE
Comment: NORMAL
Comment: NORMAL
Neisseria Gonorrhea: NEGATIVE
Neisseria Gonorrhea: NEGATIVE

## 2024-12-17 LAB — URINE CYTOLOGY ANCILLARY ONLY
Chlamydia: NEGATIVE
Comment: NEGATIVE
Comment: NORMAL
Neisseria Gonorrhea: NEGATIVE

## 2024-12-21 ENCOUNTER — Other Ambulatory Visit: Payer: Self-pay

## 2024-12-21 ENCOUNTER — Encounter: Payer: Self-pay | Admitting: Pharmacist

## 2024-12-21 ENCOUNTER — Other Ambulatory Visit (HOSPITAL_COMMUNITY): Payer: Self-pay

## 2024-12-21 DIAGNOSIS — A539 Syphilis, unspecified: Secondary | ICD-10-CM

## 2024-12-21 LAB — CBC WITH DIFFERENTIAL/PLATELET
Absolute Lymphocytes: 2569 {cells}/uL (ref 850–3900)
Absolute Monocytes: 313 {cells}/uL (ref 200–950)
Basophils Absolute: 17 {cells}/uL (ref 0–200)
Basophils Relative: 0.3 %
Eosinophils Absolute: 17 {cells}/uL (ref 15–500)
Eosinophils Relative: 0.3 %
HCT: 44.2 % (ref 39.4–51.1)
Hemoglobin: 15 g/dL (ref 13.2–17.1)
MCH: 29.2 pg (ref 27.0–33.0)
MCHC: 33.9 g/dL (ref 31.6–35.4)
MCV: 86 fL (ref 81.4–101.7)
MPV: 9.5 fL (ref 7.5–12.5)
Monocytes Relative: 5.4 %
Neutro Abs: 2883 {cells}/uL (ref 1500–7800)
Neutrophils Relative %: 49.7 %
Platelets: 335 10*3/uL (ref 140–400)
RBC: 5.14 Million/uL (ref 4.20–5.80)
RDW: 14.6 % (ref 11.0–15.0)
Total Lymphocyte: 44.3 %
WBC: 5.8 10*3/uL (ref 3.8–10.8)

## 2024-12-21 LAB — T PALLIDUM AB: T Pallidum Abs: POSITIVE — AB

## 2024-12-21 LAB — HIV-1 RNA QUANT-NO REFLEX-BLD
HIV 1 RNA Quant: NOT DETECTED {copies}/mL
HIV-1 RNA Quant, Log: NOT DETECTED {Log_copies}/mL

## 2024-12-21 LAB — COMPLETE METABOLIC PANEL WITHOUT GFR
AG Ratio: 1.7 (calc) (ref 1.0–2.5)
ALT: 15 U/L (ref 9–46)
AST: 16 U/L (ref 10–40)
Albumin: 5 g/dL (ref 3.6–5.1)
Alkaline phosphatase (APISO): 56 U/L (ref 36–130)
BUN: 9 mg/dL (ref 7–25)
CO2: 27 mmol/L (ref 20–32)
Calcium: 10.1 mg/dL (ref 8.6–10.3)
Chloride: 106 mmol/L (ref 98–110)
Creat: 1.19 mg/dL (ref 0.60–1.24)
Globulin: 2.9 g/dL (ref 1.9–3.7)
Glucose, Bld: 84 mg/dL (ref 65–99)
Potassium: 3.9 mmol/L (ref 3.5–5.3)
Sodium: 142 mmol/L (ref 135–146)
Total Bilirubin: 0.7 mg/dL (ref 0.2–1.2)
Total Protein: 7.9 g/dL (ref 6.1–8.1)

## 2024-12-21 LAB — SYPHILIS: RPR W/REFLEX TO RPR TITER AND TREPONEMAL ANTIBODIES, TRADITIONAL SCREENING AND DIAGNOSIS ALGORITHM: RPR Ser Ql: REACTIVE — AB

## 2024-12-21 LAB — RPR TITER: RPR Titer: 1:1 {titer} — ABNORMAL HIGH

## 2024-12-21 MED ORDER — DOXYCYCLINE HYCLATE 100 MG PO TABS
100.0000 mg | ORAL_TABLET | Freq: Two times a day (BID) | ORAL | 0 refills | Status: AC
  Filled 2024-12-21: qty 56, 28d supply, fill #0

## 2024-12-21 NOTE — Telephone Encounter (Signed)
 Per Dr. Luiz, patient needs doxycycline  100 mg BID x 28 days.   Ahyan Kreeger, BSN, RN

## 2024-12-21 NOTE — Telephone Encounter (Signed)
 Patient called, would like medication sent to [facility]. Advised patient no sex until treatment is completed and instructed to notify sexual partners for testing and treatment. Patient verbalized understanding and has no further questions.   Carsten Carstarphen, BSN, RN

## 2024-12-21 NOTE — Telephone Encounter (Signed)
 Patient saw Dr. Luiz so will let her weigh in on his RPR and if she feels he needs treatment.

## 2024-12-21 NOTE — Addendum Note (Signed)
 Addended by: FLORENE BOUCHARD D on: 12/21/2024 03:52 PM   Modules accepted: Orders

## 2024-12-22 ENCOUNTER — Telehealth: Payer: Self-pay

## 2024-12-22 ENCOUNTER — Other Ambulatory Visit (HOSPITAL_COMMUNITY): Payer: Self-pay

## 2024-12-22 NOTE — Telephone Encounter (Signed)
 [facility] Patient Advocate Encounter  Patient's medications CABENUVA  have been couriered to [facility] from [facility] and will be administered at the patients appointment on 02/14/25.  Charmaine Sharps, CPhT Specialty Pharmacy Patient [facility] Phone: [phone] Fax:  [phone]

## 2024-12-23 ENCOUNTER — Telehealth: Payer: Self-pay | Admitting: Internal Medicine

## 2024-12-23 NOTE — Telephone Encounter (Signed)
 Matthew Schroeder called requesting to speak with a nurse regarding the medication he was prescribed at his previous appt. Pt can be reached at [phone].

## 2024-12-23 NOTE — Telephone Encounter (Signed)
 Spoke with patient regarding message above- he was interested in injections to treat for syphillis. Informed him that currently there is a penicillin  shortage and that office would not able to treat with penicillin . Is okay with plan for Doxy.  Lorenda CHRISTELLA Code, RMA

## 2024-12-28 ENCOUNTER — Other Ambulatory Visit (HOSPITAL_COMMUNITY): Payer: Self-pay

## 2024-12-29 ENCOUNTER — Other Ambulatory Visit: Payer: Self-pay | Admitting: Pharmacist

## 2024-12-29 ENCOUNTER — Other Ambulatory Visit: Payer: Self-pay

## 2024-12-29 NOTE — Progress Notes (Signed)
 Clinical Intervention Note  Clinical Intervention Notes: Patient called with questions regarding doxycycline  that was prescribed for possible STI. He was concerned about interaction with dairy products because he has been reading food labels and many had milk derivatives in them.  He was unsure exactly what he could take it with everyday. I counseled patient on the dairy products he should avoid when taking and explained to him that if he wanted to eat dairy that he would just need to wait a couple of hours before taking his dose of doxycycline . Patient verbalized understanding.   Clinical Intervention Outcomes: Improved therapy adherence; Improved therapy effectiveness   Lyle LELON Chalk Specialty Pharmacist

## 2025-02-14 ENCOUNTER — Ambulatory Visit: Admitting: Pharmacist
